# Patient Record
Sex: Female | Born: 1990 | Race: White | Hispanic: No | Marital: Single | Smoking: Current every day smoker
Health system: Southern US, Community
[De-identification: ages and names within clinical notes are randomized; demographics above are authoritative.]

## PROBLEM LIST (undated history)

## (undated) ENCOUNTER — Inpatient Hospital Stay (HOSPITAL_COMMUNITY): Payer: Self-pay

## (undated) DIAGNOSIS — F1120 Opioid dependence, uncomplicated: Secondary | ICD-10-CM

## (undated) DIAGNOSIS — B009 Herpesviral infection, unspecified: Secondary | ICD-10-CM

## (undated) DIAGNOSIS — Z1159 Encounter for screening for other viral diseases: Secondary | ICD-10-CM

## (undated) DIAGNOSIS — Z765 Malingerer [conscious simulation]: Secondary | ICD-10-CM

## (undated) DIAGNOSIS — F410 Panic disorder [episodic paroxysmal anxiety] without agoraphobia: Secondary | ICD-10-CM

## (undated) DIAGNOSIS — R569 Unspecified convulsions: Secondary | ICD-10-CM

## (undated) HISTORY — DX: Malingerer (conscious simulation): Z76.5

## (undated) HISTORY — PX: APPENDECTOMY: SHX54

---
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
## 2011-09-11 ENCOUNTER — Encounter: Payer: Self-pay | Admitting: Emergency Medicine

## 2011-09-11 ENCOUNTER — Emergency Department (HOSPITAL_COMMUNITY)
Admission: EM | Admit: 2011-09-11 | Discharge: 2011-09-11 | Disposition: A | Discharge status: Home / Self Care | Payer: Self-pay | Attending: Emergency Medicine | Admitting: Emergency Medicine

## 2011-09-11 DIAGNOSIS — R10819 Abdominal tenderness, unspecified site: Secondary | ICD-10-CM | POA: Insufficient documentation

## 2011-09-11 DIAGNOSIS — N72 Inflammatory disease of cervix uteri: Secondary | ICD-10-CM | POA: Insufficient documentation

## 2011-09-11 DIAGNOSIS — N898 Other specified noninflammatory disorders of vagina: Secondary | ICD-10-CM | POA: Insufficient documentation

## 2011-09-11 DIAGNOSIS — R109 Unspecified abdominal pain: Secondary | ICD-10-CM | POA: Insufficient documentation

## 2011-09-11 DIAGNOSIS — F17200 Nicotine dependence, unspecified, uncomplicated: Secondary | ICD-10-CM | POA: Insufficient documentation

## 2011-09-11 HISTORY — DX: Panic disorder (episodic paroxysmal anxiety): F41.0

## 2011-09-11 LAB — CBC
HCT: 43.3 % (ref 36.0–46.0)
MCV: 91.4 fL (ref 78.0–100.0)
Platelets: 338 10*3/uL (ref 150–400)
RBC: 4.74 MIL/uL (ref 3.87–5.11)
WBC: 10.9 10*3/uL — ABNORMAL HIGH (ref 4.0–10.5)

## 2011-09-11 LAB — DIFFERENTIAL
Basophils Absolute: 0 10*3/uL (ref 0.0–0.1)
Eosinophils Relative: 1 % (ref 0–5)
Lymphocytes Relative: 31 % (ref 12–46)
Lymphs Abs: 3.3 10*3/uL (ref 0.7–4.0)
Monocytes Absolute: 0.9 10*3/uL (ref 0.1–1.0)
Neutro Abs: 6.5 10*3/uL (ref 1.7–7.7)

## 2011-09-11 LAB — HEPATIC FUNCTION PANEL
ALT: 13 U/L (ref 0–35)
AST: 23 U/L (ref 0–37)
Albumin: 4 g/dL (ref 3.5–5.2)
Alkaline Phosphatase: 75 U/L (ref 39–117)
Total Protein: 7.6 g/dL (ref 6.0–8.3)

## 2011-09-11 LAB — URINALYSIS, ROUTINE W REFLEX MICROSCOPIC
Glucose, UA: NEGATIVE mg/dL
Hgb urine dipstick: NEGATIVE
Protein, ur: NEGATIVE mg/dL
pH: 5.5 (ref 5.0–8.0)

## 2011-09-11 LAB — URINE MICROSCOPIC-ADD ON

## 2011-09-11 LAB — BASIC METABOLIC PANEL
CO2: 24 mEq/L (ref 19–32)
Calcium: 9.3 mg/dL (ref 8.4–10.5)
Chloride: 106 mEq/L (ref 96–112)
Glucose, Bld: 89 mg/dL (ref 70–99)
Sodium: 140 mEq/L (ref 135–145)

## 2011-09-11 LAB — WET PREP, GENITAL
Trich, Wet Prep: NONE SEEN
Yeast Wet Prep HPF POC: NONE SEEN

## 2011-09-11 MED ORDER — CEFTRIAXONE SODIUM 250 MG IJ SOLR
250.0000 mg | Freq: Once | INTRAMUSCULAR | Status: AC
  Administered 2011-09-11: 250 mg via INTRAMUSCULAR
  Filled 2011-09-11: qty 250

## 2011-09-11 MED ORDER — METRONIDAZOLE 500 MG PO TABS: 2000.0000 mg | ORAL_TABLET | Freq: Once | ORAL | Status: DC

## 2011-09-11 MED ORDER — METRONIDAZOLE 500 MG PO TABS: 500.0000 mg | ORAL_TABLET | Freq: Two times a day (BID) | ORAL | Status: AC

## 2011-09-11 MED ORDER — DOXYCYCLINE HYCLATE 100 MG PO CAPS: 100.0000 mg | ORAL_CAPSULE | Freq: Two times a day (BID) | ORAL | Status: AC

## 2011-09-11 MED ORDER — AZITHROMYCIN 1 G PO PACK
1.0000 g | PACK | Freq: Once | ORAL | Status: AC
  Administered 2011-09-11: 1 g via ORAL
  Filled 2011-09-11: qty 1

## 2011-09-11 MED ORDER — LIDOCAINE HCL (PF) 1 % IJ SOLN
INTRAMUSCULAR | Status: AC
  Administered 2011-09-11: 09:00:00
  Filled 2011-09-11: qty 5

## 2011-09-11 NOTE — ED Notes (Signed)
Pelvic cart at bedside. 

## 2011-09-11 NOTE — ED Provider Notes (Signed)
History     CSN: [id number]  Arrival date & time 09/11/11  1191   First MD Initiated Contact with Patient 09/11/11 [phone]      Chief Complaint  Patient presents with  . Abdominal Pain    (Consider location/radiation/quality/duration/timing/severity/associated sxs/prior treatment) Patient is a 20 y.o. female presenting with abdominal pain. The history is provided by the patient.  Abdominal Pain The primary symptoms of the illness include abdominal pain.   patient presents emergency Dept. with a one-week history of vaginal discharge.  She states that she has had some mild lower abdominal pain with this.  She states that she had sexual contact with another female who informed her after that she had chlamydia.  She states that she does not have dysuria, diarrhea/vomiting/nausea, fever, back pain, chest pain, or shortness of breath.  Patient states that she has not had any sexually transmitted diseases in the past.   Past Medical History  Diagnosis Date  . Panic attack     Past Surgical History  Procedure Date  . Appendectomy     No family history on file.  History  Substance Use Topics  . Smoking status: Current Everyday Smoker  . Smokeless tobacco: Not on file  . Alcohol Use: Yes     OCCASIONAL    OB History    Grav Para Term Preterm Abortions TAB SAB Ect Mult Living                  Review of Systems  Gastrointestinal: Positive for abdominal pain.  All pertinent positives and negatives reviewed in the history of present illness   Allergies  Review of patient's allergies indicates no known allergies.  Home Medications  No current outpatient prescriptions on file.  BP 115/64  Pulse 71  Temp(Src) 98 F (36.7 C) (Oral)  Resp 16  SpO2 98%  LMP 08/28/2011  Physical Exam  Constitutional: She is oriented to person, place, and time. She appears well-developed and well-nourished.  HENT:  Head: Normocephalic and atraumatic.  Eyes: Pupils are equal, round, and  reactive to light.  Cardiovascular: Normal rate, regular rhythm and normal heart sounds.   Pulmonary/Chest: Effort normal and breath sounds normal.  Abdominal: Soft. She exhibits no distension. There is tenderness. There is no rebound and no guarding.  Genitourinary: There is no rash, tenderness or lesion on the right labia. There is no rash, tenderness or lesion on the left labia. Cervix exhibits discharge. Cervix exhibits no motion tenderness. Right adnexum displays tenderness. Right adnexum displays no mass and no fullness. Left adnexum displays no mass, no tenderness and no fullness. There is tenderness around the vagina. No bleeding around the vagina. Vaginal discharge found.  Neurological: She is alert and oriented to person, place, and time.  Skin: Skin is warm and dry. No rash noted. No erythema. No pallor.    ED Course  Procedures (including critical care time)  Labs Reviewed  CBC - Abnormal; Notable for the following:    WBC 10.9 (*)    All other components within normal limits  URINALYSIS, ROUTINE W REFLEX MICROSCOPIC - Abnormal; Notable for the following:    APPearance CLOUDY (*)    Nitrite POSITIVE (*)    Leukocytes, UA TRACE (*)    All other components within normal limits  HEPATIC FUNCTION PANEL - Abnormal; Notable for the following:    Total Bilirubin 0.2 (*)    All other components within normal limits  URINE MICROSCOPIC-ADD ON - Abnormal; Notable for the following:  Squamous Epithelial / LPF FEW (*)    Bacteria, UA MANY (*)    All other components within normal limits  DIFFERENTIAL  BASIC METABOLIC PANEL  LIPASE, BLOOD  POCT PREGNANCY, URINE  GC/CHLAMYDIA PROBE AMP, GENITAL  WET PREP, GENITAL    Patient will be given IM Rocephin and Zithromax here in the emergency department.  I will treat her with 14 days worth of doxycycline as well based on her urine testing.       MDM  Cervicitis based on HPI and PE.  Carlyle Dolly, PA-C 09/11/11 [phone]

## 2011-09-11 NOTE — ED Provider Notes (Signed)
Medical screening examination/treatment/procedure(s) were performed by non-physician practitioner and as supervising physician I was immediately available for consultation/collaboration.   Awa Bachicha A. Adorian Gwynne, MD 09/11/11 1535 

## 2011-09-11 NOTE — ED Notes (Signed)
PT. REPORTS MID ABDOMINAL PAIN FOR 1 WEEK , DENIES NAUSEA ,VOMITTING OR DIARRHEA , NO FEVER OR CHILLS ,  NO DYSURIA , SLIGHT VAGINAL DISCHARGE.

## 2011-09-13 LAB — GC/CHLAMYDIA PROBE AMP, GENITAL
Chlamydia, DNA Probe: NEGATIVE
GC Probe Amp, Genital: NEGATIVE

## 2012-04-23 ENCOUNTER — Encounter (HOSPITAL_COMMUNITY): Payer: Self-pay | Admitting: *Deleted

## 2012-04-23 ENCOUNTER — Emergency Department (HOSPITAL_COMMUNITY)
Admission: EM | Admit: 2012-04-23 | Discharge: 2012-04-24 | Disposition: A | Discharge status: Rehabilitation Facility | Payer: Self-pay | Attending: Emergency Medicine | Admitting: Emergency Medicine

## 2012-04-23 DIAGNOSIS — IMO0001 Reserved for inherently not codable concepts without codable children: Secondary | ICD-10-CM | POA: Insufficient documentation

## 2012-04-23 DIAGNOSIS — F1910 Other psychoactive substance abuse, uncomplicated: Secondary | ICD-10-CM | POA: Insufficient documentation

## 2012-04-23 DIAGNOSIS — F1110 Opioid abuse, uncomplicated: Secondary | ICD-10-CM | POA: Insufficient documentation

## 2012-04-23 HISTORY — DX: Opioid dependence, uncomplicated: F11.20

## 2012-04-23 LAB — COMPREHENSIVE METABOLIC PANEL
AST: 13 U/L (ref 0–37)
Albumin: 4.2 g/dL (ref 3.5–5.2)
BUN: 11 mg/dL (ref 6–23)
Calcium: 9.5 mg/dL (ref 8.4–10.5)
Chloride: 100 mEq/L (ref 96–112)
Creatinine, Ser: 0.69 mg/dL (ref 0.50–1.10)
GFR calc non Af Amer: >90 mL/min (ref >90)
Total Bilirubin: 0.2 mg/dL — ABNORMAL LOW (ref 0.3–1.2)

## 2012-04-23 LAB — RAPID URINE DRUG SCREEN, HOSP PERFORMED
Amphetamines: NOT DETECTED
Cocaine: POSITIVE — AB
Opiates: POSITIVE — AB

## 2012-04-23 LAB — CBC
HCT: 41.8 % (ref 36.0–46.0)
MCH: 30 pg (ref 26.0–34.0)
MCV: 90.9 fL (ref 78.0–100.0)
Platelets: 392 10*3/uL (ref 150–400)
RDW: 13.1 % (ref 11.5–15.5)
WBC: 9.5 10*3/uL (ref 4.0–10.5)

## 2012-04-23 LAB — ETHANOL: Alcohol, Ethyl (B): <11 mg/dL (ref 0–11)

## 2012-04-23 MED ORDER — ALUM & MAG HYDROXIDE-SIMETH 200-200-20 MG/5ML PO SUSP: 30.0000 mL | ORAL | Status: DC | PRN

## 2012-04-23 MED ORDER — LORAZEPAM 1 MG PO TABS
1.0000 mg | ORAL_TABLET | Freq: Three times a day (TID) | ORAL | Status: DC | PRN
  Administered 2012-04-23 – 2012-04-24 (×3): 1 mg via ORAL
  Filled 2012-04-23 (×3): qty 1

## 2012-04-23 MED ORDER — ONDANSETRON HCL 4 MG PO TABS
4.0000 mg | ORAL_TABLET | Freq: Three times a day (TID) | ORAL | Status: DC | PRN
  Administered 2012-04-24: 4 mg via ORAL
  Filled 2012-04-23: qty 1

## 2012-04-23 MED ORDER — ACETAMINOPHEN 325 MG PO TABS: 650.0000 mg | ORAL_TABLET | ORAL | Status: DC | PRN

## 2012-04-23 MED ORDER — NICOTINE 21 MG/24HR TD PT24
21.0000 mg | MEDICATED_PATCH | Freq: Every day | TRANSDERMAL | Status: DC
  Administered 2012-04-23 – 2012-04-24 (×2): 21 mg via TRANSDERMAL
  Filled 2012-04-23 (×2): qty 1

## 2012-04-23 NOTE — BH Assessment (Signed)
Assessment Note   Shannon Morrison is an 21 y.o. female who presents voluntarily to [facility] for heroin detox. Pt states only other time she went to detox was 2012 at [facility] but his family made her go. Pt reports she really wants to get clean b/c she's sick of feeling bad. She has no hx of seizures. Current withdrawal symptoms include sweats, nausea. Pt reports labile mood and affect is appropriate to situation. Pt reports frequent panic attacks and states she takes her grandmother's 1 mg Xanax 3 times per week to reduce anxiety.  She denies AH & VH. No delusions noted. Pt denies SI and HI. Pt's mother died of crack cocaine overdose when pt was 20 yo and many of her relatives have SA issues. Pt was released from jail May 21st after 45 days for probation violation. Pt first used heroin at age 51. She injects approx 1g to 1.5 g daily for past 3 years. She injected 0.5 grams on 04/23/12. Pt first used THC at age 4. She smokes approx 1 joint 3 x week and has done so for 9 years. She smoked 1 joint 04/22/12. Pt "rarely" uses cocaine but used 2 lines 04/22/12. She has 22 year old and 74 year old children who live with relatives.  Axis I: Opiate Dependence            Anxiety Disorder NOS Axis II: Deferred Axis III:  Past Medical History  Diagnosis Date  . Panic attack   . Heroin addiction     x 3 years   Axis IV: economic problems, other psychosocial or environmental problems, problems related to legal system/crime, problems related to social environment and problems with primary support group Axis V: 31-40 impairment in reality testing  Past Medical History:  Past Medical History  Diagnosis Date  . Panic attack   . Heroin addiction     x 3 years    Past Surgical History  Procedure Date  . Appendectomy     Family History: History reviewed. No pertinent family history.  Social History:  reports that she has been smoking Cigarettes.  She has been smoking about 1 pack per day. She does not have any  smokeless tobacco history on file. She reports that she drinks alcohol. She reports that she uses illicit drugs (Marijuana and Heroin).  Additional Social History:  Alcohol / Drug Use Pain Medications: n/a Prescriptions: uses grandmother's xanax Over the Counter: n/a History of alcohol / drug use?: Yes Negative Consequences of Use: Personal relationships;Work / Optician, dispensing Withdrawal Symptoms: Fever / Chills;Sweats;Nausea / Vomiting;Weakness Substance #1 Name of Substance 1: heroin - injects it 1 - Age of First Use: 18 1 - Amount (size/oz): 1 g to 1.5 grams 1 - Frequency: daily 1 - Duration: for past 3 years 1 - Last Use / Amount: 04/23/02 - 0.5 g Substance #2 Name of Substance 2: THC 2 - Age of First Use: 12 2 - Amount (size/oz): 1 joint 2 - Frequency: 3 x week 2 - Duration: 9 yrs 2 - Last Use / Amount: 04/22/12 - 1 joint Substance #3 Name of Substance 3: xanax 1 mg 3 - Age of First Use: 17 3 - Amount (size/oz): 1 mg 3 - Frequency: once every 3 days when anxious 3 - Duration: 4 years 3 - Last Use / Amount: unknown Substance #4 Name of Substance 4: cocaine 4 - Age of First Use: 17 4 - Amount (size/oz): 2 lines 4 - Frequency: "rarely" 4 - Duration: rarely 4 -  Last Use / Amount: 04/22/12 - 2 lines  CIWA: CIWA-Ar BP: 109/57 mmHg Pulse Rate: 63  COWS:    Allergies: No Known Allergies  Home Medications:  (Not in a hospital admission)  OB/GYN Status:  Patient's last menstrual period was 04/14/2012.  General Assessment Data Location of Assessment: [facility] ED Living Arrangements: Non-relatives/Friends Can pt return to current living arrangement?: Yes Admission Status: Voluntary Is patient capable of signing voluntary admission?: Yes Transfer from: Acute Hospital Referral Source: Self/Family/Friend  Education Status Is patient currently in school?: No Current Grade: na Highest grade of school patient has completed: 9 Name of school: [employer]  person: na  Risk to self Suicidal Ideation: No Suicidal Intent: No Is patient at risk for suicide?: No Suicidal Plan?: No-Not Currently/Within Last 6 Months Access to Means: No What has been your use of drugs/alcohol within the last 12 months?: daily heroin use, thc 3 x week Previous Attempts/Gestures: No How many times?: 0  Other Self Harm Risks: na Triggers for Past Attempts:  (na) Intentional Self Injurious Behavior: None Family Suicide History: No Recent stressful life event(s):  (pt denies stressors) Persecutory voices/beliefs?: No Depression: Yes Depression Symptoms: Isolating;Loss of interest in usual pleasures Substance abuse history and/or treatment for substance abuse?: Yes [facility] 2012) Suicide prevention information given to non-admitted patients: Not applicable  Risk to Others Homicidal Ideation: No Thoughts of Harm to Others: No Current Homicidal Intent: No Current Homicidal Plan: No Access to Homicidal Means: No Identified Victim: na History of harm to others?: No Assessment of Violence: None Noted Violent Behavior Description: pt calm and cooperative Does patient have access to weapons?: No Criminal Charges Pending?: No Does patient have a court date: No  Psychosis Hallucinations: None noted Delusions: None noted  Mental Status Report Appear/Hygiene: Disheveled Eye Contact: Good Motor Activity: Freedom of movement Speech: Logical/coherent Level of Consciousness: Alert Mood: Labile Affect: Appropriate to circumstance Anxiety Level: Panic Attacks Panic attack frequency: 1 x week Most recent panic attack: last week Thought Processes: Coherent;Relevant Judgement: Unimpaired Orientation: Person;Place;Time;Situation Obsessive Compulsive Thoughts/Behaviors: None  Cognitive Functioning Concentration: Normal Memory: Recent Impaired;Remote Intact IQ: Average Insight: Fair Impulse Control: Poor Appetite: Fair Weight Loss: 0  Weight  Gain: 0  Sleep: No Change Total Hours of Sleep: 5  Vegetative Symptoms: None  ADLScreening [facility] Assessment Services) Patient's cognitive ability adequate to safely complete daily activities?: Yes Patient able to express need for assistance with ADLs?: Yes Independently performs ADLs?: Yes  Abuse/Neglect [facility]) Physical Abuse: Denies Verbal Abuse: Denies Sexual Abuse: Denies  Prior Inpatient Therapy Prior Inpatient Therapy: Yes Prior Therapy Dates: 2012 Prior Therapy Facilty/Provider(s): [facility] Reason for Treatment: detox heroin  Prior Outpatient Therapy Prior Outpatient Therapy: No Prior Therapy Dates: na Prior Therapy Facilty/Provider(s): na Reason for Treatment: na  ADL Screening (condition at time of admission) Patient's cognitive ability adequate to safely complete daily activities?: Yes Patient able to express need for assistance with ADLs?: Yes Independently performs ADLs?: Yes Weakness of Legs: None Weakness of Arms/Hands: None  Home Assistive Devices/Equipment Home Assistive Devices/Equipment: None    Abuse/Neglect Assessment (Assessment to be complete while patient is alone) Physical Abuse: Denies Verbal Abuse: Denies Sexual Abuse: Denies Exploitation of patient/patient's resources: Denies Self-Neglect: Denies Values / Beliefs Cultural Requests During Hospitalization: None Spiritual Requests During Hospitalization: None   Advance Directives (For Healthcare) Advance Directive: Patient does not have advance directive;Patient would not like information    Additional Information 1:1 In Past 12 Months?: No CIRT Risk:  No Elopement Risk: No Does patient have medical clearance?: Yes     Disposition:  Disposition Disposition of Patient: Inpatient treatment program;Referred to Type of inpatient treatment program: Adult Patient referred to: [facility]  On Site Evaluation by:   Reviewed with Physician:     Donnamarie Rossetti P 04/23/2012  11:08 PM

## 2012-04-23 NOTE — ED Notes (Signed)
Pt states heroin addiction, wishes detox. Pt last used heroin 6-7 hours ago.

## 2012-04-23 NOTE — ED Provider Notes (Signed)
History     CSN: [id number]  Arrival date & time 04/23/12  1953   First MD Initiated Contact with Patient 04/23/12 2105      Chief Complaint  Patient presents with  . Medical Clearance    (Consider location/radiation/quality/duration/timing/severity/associated sxs/prior treatment) HPI Hx from pt. Shannon Morrison is a 21 y.o. female presenting for medical clearance. She states that she has been using heroin daily for the past 3 years and presents today seeking detox. She last used approx 6-7 hrs prior to arrival. Has undergone detox one time in the past but states it was "only because my family made me. I really didn't want to go." She occasionally uses marijuana and etoh as well but no other substance use. States she has hx of anxiety but denies HI/SI. She denies somatic complaints at this time.  Past Medical History  Diagnosis Date  . Panic attack   . Heroin addiction     x 3 years    Past Surgical History  Procedure Date  . Appendectomy     History reviewed. No pertinent family history.  History  Substance Use Topics  . Smoking status: Current Everyday Smoker -- 1.0 packs/day    Types: Cigarettes  . Smokeless tobacco: Not on file  . Alcohol Use: Yes     OCCASIONAL    OB History    Grav Para Term Preterm Abortions TAB SAB Ect Mult Living                  Review of Systems  Constitutional: Negative for fever and chills.  HENT: Negative for congestion, sore throat and neck pain.   Eyes: Negative for photophobia and visual disturbance.  Respiratory: Negative for cough and shortness of breath.   Cardiovascular: Negative for chest pain.  Gastrointestinal: Negative for nausea, vomiting and abdominal pain.  Genitourinary: Negative for dysuria, vaginal bleeding and vaginal discharge.  Musculoskeletal: Positive for myalgias (generalized myalgias 2/2 withdrawal).  Skin: Negative for rash.  Neurological: Negative for dizziness, weakness and headaches.    Psychiatric/Behavioral: Negative for hallucinations, confusion, disturbed wake/sleep cycle and self-injury. The patient is not nervous/anxious.     Allergies  Review of patient's allergies indicates no known allergies.  Home Medications  No current outpatient prescriptions on file.  BP 123/62  Pulse 88  Temp 98.5 F (36.9 C) (Oral)  Resp 16  SpO2 98%  LMP 04/14/2012  Physical Exam  Nursing note and vitals reviewed. Constitutional: She is oriented to person, place, and time. She appears well-developed and well-nourished. No distress.  HENT:  Head: Normocephalic and atraumatic.  Mouth/Throat: Oropharynx is clear and moist. No oropharyngeal exudate.  Eyes: EOM are normal. Pupils are equal, round, and reactive to light.  Neck: Normal range of motion.  Cardiovascular: Normal rate, regular rhythm and normal heart sounds.   Pulmonary/Chest: Effort normal and breath sounds normal. She exhibits no tenderness.  Abdominal: Soft. Bowel sounds are normal. There is no tenderness. There is no rebound and no guarding.  Musculoskeletal: Normal range of motion.  Neurological: She is alert and oriented to person, place, and time. No cranial nerve deficit.  Skin: Skin is warm and dry. She is not diaphoretic.  Psychiatric: She has a normal mood and affect.    ED Course  Procedures (including critical care time)  Labs Reviewed  COMPREHENSIVE METABOLIC PANEL - Abnormal; Notable for the following:    Total Bilirubin 0.2 (*)     All other components within normal limits  URINE RAPID DRUG  SCREEN (HOSP PERFORMED) - Abnormal; Notable for the following:    Opiates POSITIVE (*)     Cocaine POSITIVE (*)     Benzodiazepines POSITIVE (*)     Tetrahydrocannabinol POSITIVE (*)     All other components within normal limits  CBC  ETHANOL  POCT PREGNANCY, URINE   No results found.   No diagnosis found.    MDM  Pt presents seeking heroin detox. Medically clear for further evaluation in the  psych ED. I d/w ACT who will see.      Grant Fontana, PA-C 04/23/12 2203

## 2012-04-23 NOTE — ED Notes (Signed)
ACT team in to see patient. 

## 2012-04-24 MED ORDER — ONDANSETRON 4 MG PO TBDP
8.0000 mg | ORAL_TABLET | Freq: Once | ORAL | Status: AC
  Administered 2012-04-24: 8 mg via ORAL

## 2012-04-24 MED ORDER — ONDANSETRON HCL 4 MG PO TABS
4.0000 mg | ORAL_TABLET | Freq: Four times a day (QID) | ORAL | Status: DC | PRN
  Administered 2012-04-24: 4 mg via ORAL
  Filled 2012-04-24: qty 1

## 2012-04-24 MED ORDER — ONDANSETRON 4 MG PO TBDP
ORAL_TABLET | ORAL | Status: AC
  Administered 2012-04-24: 8 mg via ORAL
  Filled 2012-04-24: qty 2

## 2012-04-24 MED ORDER — PROMETHAZINE HCL 25 MG PO TABS
25.0000 mg | ORAL_TABLET | Freq: Four times a day (QID) | ORAL | Status: DC | PRN
  Administered 2012-04-24: 25 mg via ORAL
  Filled 2012-04-24: qty 1

## 2012-04-24 NOTE — BH Assessment (Signed)
[facility] Assessment Progress Note      Pt has been reassessed and screening completed at [facility]; pt was accepted at 1830 by Arlys John. [facility] given cell phone number to contact when [facility] will pick up (may be as late as midnight per Arlys John).  Dr and nursing staff notified and agreeable with disposition. Awaiting transfer.

## 2012-04-24 NOTE — ED Provider Notes (Signed)
Medical screening examination/treatment/procedure(s) were performed by non-physician practitioner and as supervising physician I was immediately available for consultation/collaboration. Devoria Albe, MD, Armando Gang   Ward Givens, MD 04/24/12 [phone]

## 2012-07-12 ENCOUNTER — Emergency Department (HOSPITAL_COMMUNITY)
Admission: EM | Admit: 2012-07-12 | Discharge: 2012-07-12 | Disposition: A | Discharge status: Home / Self Care | Payer: Self-pay | Attending: Emergency Medicine | Admitting: Emergency Medicine

## 2012-07-12 ENCOUNTER — Encounter (HOSPITAL_COMMUNITY): Payer: Self-pay | Admitting: Emergency Medicine

## 2012-07-12 DIAGNOSIS — R112 Nausea with vomiting, unspecified: Secondary | ICD-10-CM | POA: Insufficient documentation

## 2012-07-12 DIAGNOSIS — Z8669 Personal history of other diseases of the nervous system and sense organs: Secondary | ICD-10-CM | POA: Insufficient documentation

## 2012-07-12 DIAGNOSIS — F17200 Nicotine dependence, unspecified, uncomplicated: Secondary | ICD-10-CM | POA: Insufficient documentation

## 2012-07-12 DIAGNOSIS — F1911 Other psychoactive substance abuse, in remission: Secondary | ICD-10-CM | POA: Insufficient documentation

## 2012-07-12 HISTORY — DX: Unspecified convulsions: R56.9

## 2012-07-12 LAB — URINALYSIS, ROUTINE W REFLEX MICROSCOPIC
Glucose, UA: NEGATIVE mg/dL
Leukocytes, UA: NEGATIVE
Nitrite: NEGATIVE
Protein, ur: 30 mg/dL — AB
pH: 6 (ref 5.0–8.0)

## 2012-07-12 LAB — COMPREHENSIVE METABOLIC PANEL
ALT: 16 U/L (ref 0–35)
AST: 18 U/L (ref 0–37)
Albumin: 4 g/dL (ref 3.5–5.2)
Alkaline Phosphatase: 67 U/L (ref 39–117)
BUN: 11 mg/dL (ref 6–23)
Chloride: 101 mEq/L (ref 96–112)
Potassium: 3.7 mEq/L (ref 3.5–5.1)
Sodium: 137 mEq/L (ref 135–145)
Total Bilirubin: 0.3 mg/dL (ref 0.3–1.2)
Total Protein: 7.5 g/dL (ref 6.0–8.3)

## 2012-07-12 LAB — CBC WITH DIFFERENTIAL/PLATELET
Basophils Absolute: 0 10*3/uL (ref 0.0–0.1)
Basophils Relative: 0 % (ref 0–1)
Hemoglobin: 14.5 g/dL (ref 12.0–15.0)
MCHC: 33.2 g/dL (ref 30.0–36.0)
Monocytes Relative: 3 % (ref 3–12)
Neutro Abs: 9.3 10*3/uL — ABNORMAL HIGH (ref 1.7–7.7)
Neutrophils Relative %: 83 % — ABNORMAL HIGH (ref 43–77)
Platelets: 328 10*3/uL (ref 150–400)

## 2012-07-12 LAB — URINE MICROSCOPIC-ADD ON

## 2012-07-12 LAB — PREGNANCY, URINE: Preg Test, Ur: NEGATIVE

## 2012-07-12 MED ORDER — PROMETHAZINE HCL 25 MG/ML IJ SOLN
25.0000 mg | Freq: Once | INTRAMUSCULAR | Status: AC
  Administered 2012-07-12: 25 mg via INTRAMUSCULAR

## 2012-07-12 MED ORDER — SODIUM CHLORIDE 0.9 % IV BOLUS (SEPSIS): 1000.0000 mL | Freq: Once | INTRAVENOUS | Status: DC

## 2012-07-12 MED ORDER — PROMETHAZINE HCL 25 MG/ML IJ SOLN
12.5000 mg | Freq: Once | INTRAMUSCULAR | Status: DC
  Filled 2012-07-12: qty 1

## 2012-07-12 MED ORDER — SODIUM CHLORIDE 0.9 % IV BOLUS (SEPSIS)
1000.0000 mL | Freq: Once | INTRAVENOUS | Status: AC
  Administered 2012-07-12: 1000 mL via INTRAVENOUS

## 2012-07-12 MED ORDER — ONDANSETRON 4 MG PO TBDP
8.0000 mg | ORAL_TABLET | Freq: Once | ORAL | Status: AC
  Administered 2012-07-12: 8 mg via ORAL
  Filled 2012-07-12: qty 2

## 2012-07-12 NOTE — ED Provider Notes (Signed)
History     CSN: [id number]  Arrival date & time 07/12/12  0819   First MD Initiated Contact with Patient 07/12/12 [phone]      Chief Complaint  Patient presents with  . Emesis    (Consider location/radiation/quality/duration/timing/severity/associated sxs/prior treatment) HPI Pt is a 21 yo female who presents to the ER with a cc of nausea and vomiting.  The first episode of vomiting occurred at 5:40am this morning before she ate the second occurred shortly after she ate breakfast.  LMP was reported as being 4 days ago.  Pt is not on birth control.  Nothing makes it better, eating seemed to make it worse. Did not try any medications prior to the arrival. Pt denies fever, chills, upper respiratory symptoms, diarrhea, abdominal pain, chest pain, SOB, HA, weakness, loss of consciousness, dysuria, dizziness, or blurred vision.  Past Medical History  Diagnosis Date  . Panic attack   . Heroin addiction     x 3 years  . Seizures     Past Surgical History  Procedure Date  . Appendectomy     No family history on file.  History  Substance Use Topics  . Smoking status: Current Every Day Smoker -- 1.0 packs/day    Types: Cigarettes  . Smokeless tobacco: Not on file  . Alcohol Use: No     OCCASIONAL    OB History    Grav Para Term Preterm Abortions TAB SAB Ect Mult Living                  Review of Systems  Gastrointestinal: Positive for nausea and vomiting.  All other systems reviewed and are negative.     Allergies  Review of patient's allergies indicates no known allergies.  Home Medications  No current outpatient prescriptions on file.  BP 103/85  Pulse 56  Temp 97.9 F (36.6 C) (Oral)  Resp 20  Ht 5\' 9"  (1.753 m)  Wt 150 lb (68.04 kg)  BMI 22.15 kg/m2  SpO2 100%  Physical Exam  Constitutional: She is oriented to person, place, and time. She appears well-developed and well-nourished. No distress.  HENT:  Head: Normocephalic and atraumatic.    Mouth/Throat: Oropharynx is clear and moist.  Eyes: Pupils are equal, round, and reactive to light.  Neck: Normal range of motion.  Cardiovascular: Normal rate, regular rhythm and normal heart sounds.   Pulmonary/Chest: Effort normal and breath sounds normal. No respiratory distress. She has no wheezes.  Abdominal: Soft. Bowel sounds are normal. She exhibits no distension. There is no hepatosplenomegaly. There is no tenderness. There is no rebound and no guarding.  Neurological: She is alert and oriented to person, place, and time.  Skin: Skin is warm and dry. No rash noted. No erythema.    ED Course  Procedures (including critical care time) Pt with N/V onset this morning. No abdominal pain. Afebrile. VS normal. Will get labs, fluids, anti emetics.   Results for orders placed during the hospital encounter of 07/12/12  URINALYSIS, ROUTINE W REFLEX MICROSCOPIC      Component Value Range   Color, Urine AMBER (*) YELLOW   APPearance HAZY (*) CLEAR   Specific Gravity, Urine 1.035 (*) 1.005 - 1.030   pH 6.0  5.0 - 8.0   Glucose, UA NEGATIVE  NEGATIVE mg/dL   Hgb urine dipstick NEGATIVE  NEGATIVE   Bilirubin Urine MODERATE (*) NEGATIVE   Ketones, ur 40 (*) NEGATIVE mg/dL   Protein, ur 30 (*) NEGATIVE mg/dL  Urobilinogen, UA 1.0  0.0 - 1.0 mg/dL   Nitrite NEGATIVE  NEGATIVE   Leukocytes, UA NEGATIVE  NEGATIVE  PREGNANCY, URINE      Component Value Range   Preg Test, Ur NEGATIVE  NEGATIVE  CBC WITH DIFFERENTIAL      Component Value Range   WBC 11.3 (*) 4.0 - 10.5 K/uL   RBC 4.72  3.87 - 5.11 MIL/uL   Hemoglobin 14.5  12.0 - 15.0 g/dL   HCT 56.2  13.0 - 86.5 %   MCV 92.6  78.0 - 100.0 fL   MCH 30.7  26.0 - 34.0 pg   MCHC 33.2  30.0 - 36.0 g/dL   RDW 78.4  69.6 - 29.5 %   Platelets 328  150 - 400 K/uL   Neutrophils Relative 83 (*) 43 - 77 %   Neutro Abs 9.3 (*) 1.7 - 7.7 K/uL   Lymphocytes Relative 13  12 - 46 %   Lymphs Abs 1.5  0.7 - 4.0 K/uL   Monocytes Relative 3  3 - 12 %    Monocytes Absolute 0.4  0.1 - 1.0 K/uL   Eosinophils Relative 1  0 - 5 %   Eosinophils Absolute 0.1  0.0 - 0.7 K/uL   Basophils Relative 0  0 - 1 %   Basophils Absolute 0.0  0.0 - 0.1 K/uL  COMPREHENSIVE METABOLIC PANEL      Component Value Range   Sodium 137  135 - 145 mEq/L   Potassium 3.7  3.5 - 5.1 mEq/L   Chloride 101  96 - 112 mEq/L   CO2 26  19 - 32 mEq/L   Glucose, Bld 91  70 - 99 mg/dL   BUN 11  6 - 23 mg/dL   Creatinine, Ser 2.84  0.50 - 1.10 mg/dL   Calcium 9.3  8.4 - 13.2 mg/dL   Total Protein 7.5  6.0 - 8.3 g/dL   Albumin 4.0  3.5 - 5.2 g/dL   AST 18  0 - 37 U/L   ALT 16  0 - 35 U/L   Alkaline Phosphatase 67  39 - 117 U/L   Total Bilirubin 0.3  0.3 - 1.2 mg/dL   GFR calc non Af Amer >90  >90 mL/min   GFR calc Af Amer >90  >90 mL/min  LIPASE, BLOOD      Component Value Range   Lipase 15  11 - 59 U/L  URINE MICROSCOPIC-ADD ON      Component Value Range   Squamous Epithelial / LPF FEW (*) RARE   WBC, UA 0-2  <3 WBC/hpf   RBC / HPF 0-2  <3 RBC/hpf   Bacteria, UA RARE  RARE   Urine-Other MUCOUS PRESENT     Difficulty in obtaining IV. Will move to CDU. Plan to rehydrate. Anti emetics. If able to tolerate POs, d/c home.     1. Nausea and vomiting       MDM         Lottie Mussel, PA 07/12/12 1532

## 2012-07-12 NOTE — ED Notes (Signed)
Pt c/o vomiting X2 pta, no F/D, no urinary s/s, denies pain, no other complaints, no meds pta, NAD

## 2012-07-12 NOTE — ED Notes (Signed)
IV team called to start IV after 4 attempts.

## 2012-07-12 NOTE — ED Provider Notes (Signed)
Medical screening examination/treatment/procedure(s) were performed by non-physician practitioner and as supervising physician I was immediately available for consultation/collaboration.  Gerhard Munch, MD 07/12/12 [phone]

## 2012-07-12 NOTE — ED Notes (Signed)
Paged IV team 

## 2012-07-12 NOTE — ED Provider Notes (Signed)
Medical screening examination/treatment/procedure(s) were performed by non-physician practitioner and as supervising physician I was immediately available for consultation/collaboration.  Haider Hornaday, MD 07/12/12 1605 

## 2012-07-12 NOTE — ED Provider Notes (Signed)
Awoke this AM with N/V.  Vomiting x2 at home.  WBC slightly elevated, UA with ketones and elevated Bili.  Will give IVF, zofran.  She has received Phenergan IM.  Plan on transfer: IVF, antiemetics, PO challenge  Patient resting comfortably, denies abdominal pain, tolerating by mouth fluids and crackers.  Patient states her nausea has resolved.   On exam: hemodynamically stable, NAD, heart w/ RRR, lungs CTAB, Chest & abd non-tender, no peripheral edema or calf tenderness. Plan: we'll finish IV fluids and discharge home.  Dahlia Client Aithan Farrelly, PA-C 07/12/12 1322

## 2012-09-25 ENCOUNTER — Telehealth (HOSPITAL_COMMUNITY): Payer: Self-pay | Admitting: *Deleted

## 2012-09-25 NOTE — Telephone Encounter (Signed)
Telephoned # on mammogram scholarship and left message to return call to [facility]

## 2012-09-28 ENCOUNTER — Encounter (HOSPITAL_COMMUNITY): Payer: Self-pay | Admitting: *Deleted

## 2012-10-05 ENCOUNTER — Emergency Department: Payer: Self-pay | Admitting: Emergency Medicine

## 2012-10-05 LAB — COMPREHENSIVE METABOLIC PANEL
Albumin: 4.2 g/dL (ref 3.4–5.0)
Alkaline Phosphatase: 92 U/L (ref 50–136)
Anion Gap: 8 (ref 7–16)
BUN: 6 mg/dL — ABNORMAL LOW (ref 7–18)
Bilirubin,Total: 0.9 mg/dL (ref 0.2–1.0)
Calcium, Total: 9.2 mg/dL (ref 8.5–10.1)
Chloride: 108 mmol/L — ABNORMAL HIGH (ref 98–107)
Co2: 23 mmol/L (ref 21–32)
Creatinine: 0.58 mg/dL — ABNORMAL LOW (ref 0.60–1.30)
EGFR (African American): >60
EGFR (Non-African Amer.): >60
SGOT(AST): 22 U/L (ref 15–37)
Total Protein: 8.4 g/dL — ABNORMAL HIGH (ref 6.4–8.2)

## 2012-10-05 LAB — CBC
HCT: 42.8 % (ref 35.0–47.0)
MCH: 30 pg (ref 26.0–34.0)
MCHC: 33 g/dL (ref 32.0–36.0)
MCV: 91 fL (ref 80–100)
Platelet: 407 10*3/uL (ref 150–440)
RBC: 4.7 10*6/uL (ref 3.80–5.20)
RDW: 13.5 % (ref 11.5–14.5)
WBC: 10.1 10*3/uL (ref 3.6–11.0)

## 2012-10-05 LAB — DRUG SCREEN, URINE
Amphetamines, Ur Screen: NEGATIVE (ref ?–1000)
Barbiturates, Ur Screen: NEGATIVE (ref ?–200)
Benzodiazepine, Ur Scrn: POSITIVE (ref ?–200)
Cannabinoid 50 Ng, Ur ~~LOC~~: POSITIVE (ref ?–50)
Cocaine Metabolite,Ur ~~LOC~~: NEGATIVE (ref ?–300)
Opiate, Ur Screen: POSITIVE (ref ?–300)
Phencyclidine (PCP) Ur S: NEGATIVE (ref ?–25)
Tricyclic, Ur Screen: NEGATIVE (ref ?–1000)

## 2012-10-05 LAB — URINALYSIS, COMPLETE
Bilirubin,UR: NEGATIVE
Glucose,UR: NEGATIVE mg/dL (ref 0–75)
Leukocyte Esterase: NEGATIVE
Specific Gravity: 1.021 (ref 1.003–1.030)
Squamous Epithelial: 3
WBC UR: 3 /HPF (ref 0–5)

## 2012-10-05 LAB — ETHANOL
Ethanol %: <0.003 % (ref 0.000–0.080)
Ethanol: <3 mg/dL

## 2012-10-13 ENCOUNTER — Ambulatory Visit (HOSPITAL_COMMUNITY)
Admission: RE | Admit: 2012-10-13 | Discharge: 2012-10-13 | Disposition: A | Discharge status: Home / Self Care | Payer: Self-pay | Source: Ambulatory Visit | Attending: Obstetrics and Gynecology | Admitting: Obstetrics and Gynecology

## 2012-10-13 ENCOUNTER — Encounter (HOSPITAL_COMMUNITY): Payer: Self-pay

## 2012-10-13 ENCOUNTER — Other Ambulatory Visit: Payer: Self-pay | Admitting: Obstetrics and Gynecology

## 2012-10-13 VITALS — BP 100/64 | Temp 97.8°F | Ht 68.0 in | Wt 155.2 lb

## 2012-10-13 DIAGNOSIS — Z01419 Encounter for gynecological examination (general) (routine) without abnormal findings: Secondary | ICD-10-CM

## 2012-10-13 DIAGNOSIS — N63 Unspecified lump in unspecified breast: Secondary | ICD-10-CM

## 2012-10-13 DIAGNOSIS — N6314 Unspecified lump in the right breast, lower inner quadrant: Secondary | ICD-10-CM | POA: Insufficient documentation

## 2012-10-13 NOTE — Progress Notes (Signed)
Patient complained of right breast lump and greenish/bloody nipple discharge x 1 year. Patient complained of right breat pain that she rated at a 7 out of 10.  Pap Smear:  Completed Pap smear today. Patient unsure if she has ever had a Pap smear. No Pap smear results in EPIC.  Physical exam: Breasts Breasts symmetrical. No skin abnormalities left breast. Small open area on right breast at 3 o'clock next to areola and small pimple looking area at 9 o'clock on areola. No nipple retraction bilateral breasts. No nipple discharge left breat. Small amount of whitish colored nipple discharge right breast when expressed. Patient stated right nipple discharge is greenish and bloody usually. Was unable to get enough discharge to send sample to cytology. No lymphadenopathy. No lumps palpated left breast. Palpated lump right breat at 5 o'clock next to areola. Patient complained of pain when palpated lump within right breast.  Referred patient to the [facility] for right breast ultrasound. Appointment scheduled for Monday, February 3,2014 at 0915.     Pelvic/Bimanual   Ext Genitalia No lesions, no swelling and no discharge observed on external genitalia.         Vagina Vagina pink and normal texture. No lesions or discharge observed in vagina.          Cervix Cervix is present. Cervix pink and of normal texture. No discharge observed on cervix.        Uterus Uterus is present and palpable. Uterus tilted slightly to the left and normal size.       Adnexae Bilateral ovaries present and palpable. No tenderness on palpation.        Rectovaginal No rectal exam completed today since patient had no rectal complaints. No skin abnormalities observed on exam.

## 2012-10-13 NOTE — Patient Instructions (Addendum)
Taught patient how to perform BSE. Recommended to patient to have next Pap smear in 2-3 years if Pap today is normal. Referred patient to the [facility] for follow up on long menstrual cycles and birth control. Appointment scheduled for Monday, October 30, 2012 at 1415. Told patient she will need to pay a $20.00 co-pay and fill out financial assistance paperwork. Referred patient to the [facility] for right breast ultrasound. Appointment scheduled for Monday, February 3,2014 at 0915. Patient aware of appointment and will be there. Let patient know will follow up with her within the next couple weeks with results by letter or phone. Talked with patient about smoking cessation and patient refuses to quit at this time. Patient verbalized understanding.

## 2012-10-16 ENCOUNTER — Ambulatory Visit
Admission: RE | Admit: 2012-10-16 | Discharge: 2012-10-16 | Disposition: A | Discharge status: Home / Self Care | Payer: No Typology Code available for payment source | Source: Ambulatory Visit | Attending: Obstetrics and Gynecology | Admitting: Obstetrics and Gynecology

## 2012-10-16 DIAGNOSIS — N63 Unspecified lump in unspecified breast: Secondary | ICD-10-CM

## 2012-10-23 ENCOUNTER — Telehealth (HOSPITAL_COMMUNITY): Payer: Self-pay | Admitting: *Deleted

## 2012-10-23 NOTE — Telephone Encounter (Signed)
Telephoned patient at mobile # and left message to return call.

## 2012-10-30 ENCOUNTER — Ambulatory Visit (INDEPENDENT_AMBULATORY_CARE_PROVIDER_SITE_OTHER): Payer: Self-pay | Admitting: Medical

## 2012-10-30 ENCOUNTER — Encounter: Payer: Self-pay | Admitting: Medical

## 2012-10-30 VITALS — BP 108/73 | HR 71 | Temp 96.5°F | Ht 69.0 in | Wt 153.5 lb

## 2012-10-30 DIAGNOSIS — F419 Anxiety disorder, unspecified: Secondary | ICD-10-CM

## 2012-10-30 DIAGNOSIS — F411 Generalized anxiety disorder: Secondary | ICD-10-CM

## 2012-10-30 DIAGNOSIS — N921 Excessive and frequent menstruation with irregular cycle: Secondary | ICD-10-CM | POA: Insufficient documentation

## 2012-10-30 DIAGNOSIS — N920 Excessive and frequent menstruation with regular cycle: Secondary | ICD-10-CM

## 2012-10-30 LAB — CBC
Hemoglobin: 14 g/dL (ref 12.0–15.0)
MCH: 30 pg (ref 26.0–34.0)
MCV: 89.7 fL (ref 78.0–100.0)
RBC: 4.67 MIL/uL (ref 3.87–5.11)

## 2012-10-30 LAB — POCT PREGNANCY, URINE: Preg Test, Ur: NEGATIVE

## 2012-10-30 MED ORDER — NORGESTIMATE-ETH ESTRADIOL 0.25-35 MG-MCG PO TABS: 1.0000 | ORAL_TABLET | Freq: Every day | ORAL | Status: DC

## 2012-10-30 MED ORDER — ALPRAZOLAM 1 MG PO TABS: 1.0000 mg | ORAL_TABLET | Freq: Every evening | ORAL | Status: DC | PRN

## 2012-10-30 NOTE — Patient Instructions (Addendum)
Menorrhagia  Menorrhagia is when a menstrual period is heavier or longer than normal. HOME CARE  Only take medicine as told by your doctor.  Do not take aspirin 1 week before or during your period. Aspirin can make the bleeding worse.  Lay down for a while if you change your tampon or pad more than once in 2 hours. This may help lessen the bleeding.  Take any iron pills as told by your doctor. Heavy bleeding may cause you to lack iron in your body.  Eat a healthy diet and foods with iron. These foods include leafy green vegetables, meat, liver, eggs, and whole grain breads and cereals.  Eat foods that are high in vitamin C. These include oranges, orange juice, and grapefruits. Vitamin C can help your body take in more iron.  Do not try to lose weight. Wait until the heavy bleeding has stopped and your iron level is normal. GET HELP RIGHT AWAY IF:  You get a fever.  You have trouble breathing.  You bleed even more heavily than usual and pass blood clots.  You feel dizzy, weak, or pass out (faint).  You need to change your tampon or pad more than once an hour.  You feel sick to your stomach (nauseous), throw up (vomit), or have watery poop (diarrhea).  You have problems from medicine. MAKE SURE YOU:   Understand these instructions.  Will watch your condition.  Will get help right away if you are not doing well or get worse. Document Released: 06/08/2008 Document Revised: 11/22/2011 Document Reviewed: 06/08/2008 [facility] Patient Information 2013 [place], [place]. Oral Contraception Information Oral contraceptives (OCs) are medicines taken to prevent pregnancy. OCs work by preventing the ovaries from releasing eggs. The hormones in OCs also cause the cervical mucus to thicken, preventing the sperm from entering the uterus. The hormones also cause the uterine lining to become thin, not allowing a fertilized egg to attach to the inside of the uterus. OCs are highly effective when  taken exactly as prescribed. However, OCs do not prevent sexually transmitted diseases (STDs). Safe sex practices, such as using condoms along with the pill, can help prevent STDs.  Before taking the pill, you may have a physical exam and Pap test. Your caregiver may order blood tests that may be necessary. Your caregiver will make sure you are a good candidate for oral contraception. Discuss with your caregiver the possible side effects of the OC you may be prescribed. When starting an OC, it can take 2 to 3 months for the body to adjust to the changes in hormone levels in your body.  TYPES OF ORAL CONTRACEPTION  The combination pill. This pill contains estrogen and progestin (synthetic progesterone) hormones. The combination pill comes in either 21-day or 28-day packs. With 21-day packs, you do not take pills for 7 days after the last pill. With 28-day packs, the pill is taken every day. The last 7 pills are without hormones. Certain types of pills have more than 21 hormone-containing pills.  The minipill. This pill contains the progesterone hormone only. It is taken every day continuously. The minipill comes in packs of 91 pills. The first 84 pills contain the hormones, and the last 7 pills do not. The last 7 days are when you will have your menstrual period. You may experience irregular spotting. ADVANTAGES  Decreases premenstrual symptoms.  Treats menstrual period cramps.  Regulates the menstrual cycle.  Decreases a heavy menstrual flow.  Treats acne.  Treats abnormal uterine bleeding.  Treats chronic pelvic pain.  Treats polycystic ovarian syndrome.  Treats endometriosis.  Can be used as emergency contraception. DISADVANTAGES OCs can be less effective if:  You forget to take the pill at the same time every day.  You have a stomach or intestinal disease that lessens the absorption of the pill.  You take OCs with other medicines that make OCs less effective.  You take expired  OCs.  You forget to restart the pill on day 7, when using the packs of 21 pills. Document Released: 11/20/2002 Document Revised: 11/22/2011 Document Reviewed: 01/06/2011 [facility] Patient Information 2013 [place], [place].

## 2012-10-30 NOTE — Progress Notes (Signed)
Patient ID: Shannon Morrison, female   DOB: 1991-03-29, 22 y.o.   MRN: [id number]  History:  [name] is a 22 y.o. [id number] who presents to clinic today for menorrhagia. The patient states that she has had issues with heavy prolonged periods x 1 year. She also desires birth control. She was given information about all available options and desires OCPs. The patient was on OCPs at a younger age for birth control and similar heavy bleeding issues and found that they helped. Her periods have been lasting 12-17 days each month. She has not had sexual intercourse in the last 2 weeks. She uses condoms "sometimes" for birth control currently.  Patient also has known anxiety and does not currently have PCP.   The following portions of the patient's history were reviewed and updated as appropriate: allergies, current medications, past family history, past medical history, past social history, past surgical history and problem list.  Review of Systems:  Pertinent items are noted in HPI.  Objective:  Physical Exam BP 108/73  Pulse 71  Temp(Src) 96.5 F (35.8 C) (Oral)  Ht 5\' 9"  (1.753 m)  Wt 153 lb 8 oz (69.627 kg)  BMI 22.66 kg/m2  LMP 10/12/2012 GENERAL: Well-developed, well-nourished female in no acute distress.  HEENT: Normocephalic, atraumatic.  LUNGS: Normal rate. Clear to auscultation bilaterally.  HEART: Regular rate and rhythm with no adventitious sounds.  BREASTS: Deferred. Patient had recent exam and [place] with [facility].  ABDOMEN: Soft, nontender, nondistended. No organomegaly. Normal bowel sounds appreciated in all quadrants.  EXTREMITIES: No cyanosis, clubbing, or edema.  Labs and Imaging UPT - negative   Assessment & Plan:  Assessment: Menorrhagia Undesired fertility  Plans: CBC today Referral information for PCP given Rx for Xanax 1 mg #30 given today. Patient advised that she will need to establish care with a PCP to continue to monitor anxiety. She will not receive additional Rx  from this office.  Rx for Sprintec sent to patient's pharmacy. Patient advised to stop smoking.  Patient may return PRN, she will follow-up as indicated for annual exam with [facility]

## 2012-11-06 ENCOUNTER — Telehealth (HOSPITAL_COMMUNITY): Payer: Self-pay | Admitting: *Deleted

## 2012-11-06 NOTE — Telephone Encounter (Signed)
Telephoned patient at mobile # and discussed negative pap smear results. Next pap due in 3 years. Patient voiced understanding.

## 2012-11-18 ENCOUNTER — Encounter (HOSPITAL_COMMUNITY): Payer: Self-pay | Admitting: *Deleted

## 2012-11-18 DIAGNOSIS — R1110 Vomiting, unspecified: Secondary | ICD-10-CM | POA: Insufficient documentation

## 2012-11-18 DIAGNOSIS — F1120 Opioid dependence, uncomplicated: Secondary | ICD-10-CM | POA: Insufficient documentation

## 2012-11-18 DIAGNOSIS — F1210 Cannabis abuse, uncomplicated: Secondary | ICD-10-CM | POA: Insufficient documentation

## 2012-11-18 DIAGNOSIS — F17200 Nicotine dependence, unspecified, uncomplicated: Secondary | ICD-10-CM | POA: Insufficient documentation

## 2012-11-18 LAB — CBC WITH DIFFERENTIAL/PLATELET
Basophils Absolute: 0 10*3/uL (ref 0.0–0.1)
Eosinophils Relative: 1 % (ref 0–5)
HCT: 42.4 % (ref 36.0–46.0)
Lymphocytes Relative: 27 % (ref 12–46)
MCHC: 35.6 g/dL (ref 30.0–36.0)
MCV: 89.5 fL (ref 78.0–100.0)
Monocytes Absolute: 0.6 10*3/uL (ref 0.1–1.0)
RDW: 13.9 % (ref 11.5–15.5)

## 2012-11-18 LAB — ETHANOL: Alcohol, Ethyl (B): <11 mg/dL (ref 0–11)

## 2012-11-18 LAB — RAPID URINE DRUG SCREEN, HOSP PERFORMED
Barbiturates: NOT DETECTED
Benzodiazepines: POSITIVE — AB
Cocaine: NOT DETECTED
Opiates: POSITIVE — AB

## 2012-11-18 LAB — COMPREHENSIVE METABOLIC PANEL
AST: 17 U/L (ref 0–37)
CO2: 28 mEq/L (ref 19–32)
Calcium: 9.8 mg/dL (ref 8.4–10.5)
Creatinine, Ser: 0.73 mg/dL (ref 0.50–1.10)
GFR calc Af Amer: >90 mL/min (ref >90)
GFR calc non Af Amer: >90 mL/min (ref >90)

## 2012-11-18 MED ORDER — ONDANSETRON 4 MG PO TBDP
8.0000 mg | ORAL_TABLET | Freq: Once | ORAL | Status: AC
  Administered 2012-11-18: 8 mg via ORAL
  Filled 2012-11-18: qty 2

## 2012-11-18 NOTE — ED Notes (Signed)
Pt states she last used heroin at 13:00 this afternoon. Pt states that she wants to detox from heroin. Pt denies SI and HI thoughts. Pt states that she has been nauseated since her last use. Pt states she has used heroin for 3 years.

## 2012-11-18 NOTE — ED Notes (Signed)
Patient vomiting at this time. Patient medicated per protocol.

## 2012-11-19 ENCOUNTER — Emergency Department (HOSPITAL_COMMUNITY)
Admission: EM | Admit: 2012-11-19 | Discharge: 2012-11-20 | Discharge status: Against Medical Advice | Payer: Self-pay | Attending: Emergency Medicine | Admitting: Emergency Medicine

## 2012-11-19 ENCOUNTER — Encounter (HOSPITAL_COMMUNITY): Payer: Self-pay | Admitting: *Deleted

## 2012-11-19 ENCOUNTER — Emergency Department (HOSPITAL_COMMUNITY)
Admission: EM | Admit: 2012-11-19 | Discharge: 2012-11-19 | Discharge status: Against Medical Advice | Payer: Self-pay | Attending: Emergency Medicine | Admitting: Emergency Medicine

## 2012-11-19 DIAGNOSIS — F1110 Opioid abuse, uncomplicated: Secondary | ICD-10-CM | POA: Insufficient documentation

## 2012-11-19 DIAGNOSIS — R110 Nausea: Secondary | ICD-10-CM | POA: Insufficient documentation

## 2012-11-19 LAB — COMPREHENSIVE METABOLIC PANEL
ALT: 17 U/L (ref 0–35)
AST: 16 U/L (ref 0–37)
Alkaline Phosphatase: 79 U/L (ref 39–117)
CO2: 28 mEq/L (ref 19–32)
Chloride: 102 mEq/L (ref 96–112)
GFR calc Af Amer: >90 mL/min (ref >90)
GFR calc non Af Amer: >90 mL/min (ref >90)
Glucose, Bld: 74 mg/dL (ref 70–99)
Potassium: 3.7 mEq/L (ref 3.5–5.1)
Sodium: 140 mEq/L (ref 135–145)
Total Bilirubin: 0.3 mg/dL (ref 0.3–1.2)

## 2012-11-19 LAB — CBC WITH DIFFERENTIAL/PLATELET
Eosinophils Relative: 2 % (ref 0–5)
Lymphocytes Relative: 34 % (ref 12–46)
Lymphs Abs: 3.2 10*3/uL (ref 0.7–4.0)
MCV: 90.6 fL (ref 78.0–100.0)
Neutro Abs: 5.5 10*3/uL (ref 1.7–7.7)
Neutrophils Relative %: 57 % (ref 43–77)
Platelets: 334 10*3/uL (ref 150–400)
RBC: 4.8 MIL/uL (ref 3.87–5.11)
WBC: 9.5 10*3/uL (ref 4.0–10.5)

## 2012-11-19 NOTE — ED Notes (Signed)
Pt came yesterday and had a family emergency that made her leave AMA. Pt is back today for herion detox. Pt used last this morning. Pt states that she is nauseated and also she is having bilateral leg pain. Pt denies SI and HI.

## 2012-11-19 NOTE — ED Notes (Signed)
Pt stated she had a family emergency and needs to leave.  "There is a problem with my kids and I have to go.  I promise I will be back tomorrow for detox.  I really need it." RN explained to pt about her leaving AMA and pt verbalized understanding of it. E-signature obtained for elopement.  Resps e/u upon discharge.

## 2012-11-20 NOTE — ED Notes (Signed)
Pt called from waiting room for room with no answer

## 2012-11-23 ENCOUNTER — Telehealth: Payer: Self-pay

## 2012-11-23 DIAGNOSIS — N921 Excessive and frequent menstruation with irregular cycle: Secondary | ICD-10-CM

## 2012-11-23 MED ORDER — NORGESTIMATE-ETH ESTRADIOL 0.25-35 MG-MCG PO TABS: 1.0000 | ORAL_TABLET | Freq: Every day | ORAL | Status: DC

## 2012-11-23 NOTE — Addendum Note (Signed)
Addended by: Soyla Murphy T on: 11/23/2012 04:23 PM   Modules accepted: Orders

## 2012-11-23 NOTE — Telephone Encounter (Signed)
Patient called requesting a medication refill.  She did not include the name of the medication in her telephone message.  I called patient and left a message for her to call us back with more information.

## 2012-11-23 NOTE — Telephone Encounter (Signed)
I spoke with the patient.  She requested a refill for OCP's and xanax.  I informed her that she had refills available at her pharmacy for the OCP's and that this office is unable to refill her prescription for xanax and that she needs to seek a PCP.

## 2012-11-23 NOTE — Telephone Encounter (Signed)
RX sent to pharmacy at [facility] on [address] per patient request.

## 2012-11-27 ENCOUNTER — Telehealth: Payer: Self-pay | Admitting: Obstetrics and Gynecology

## 2012-11-27 NOTE — Telephone Encounter (Signed)
Patient called stating that she needs refill for xanax. Advised that according to previous note and advices; she will need to seek care from a PCP for xanax Rx. Gave [facility] information for establishing PCP. Patient agrees and satisfied.

## 2013-02-16 ENCOUNTER — Encounter (HOSPITAL_COMMUNITY): Payer: Self-pay | Admitting: Emergency Medicine

## 2013-02-16 ENCOUNTER — Emergency Department (HOSPITAL_COMMUNITY)
Admission: EM | Admit: 2013-02-16 | Discharge: 2013-02-18 | Disposition: A | Discharge status: Home / Self Care | Payer: Self-pay | Attending: Emergency Medicine | Admitting: Emergency Medicine

## 2013-02-16 DIAGNOSIS — M79609 Pain in unspecified limb: Secondary | ICD-10-CM | POA: Insufficient documentation

## 2013-02-16 DIAGNOSIS — Z8669 Personal history of other diseases of the nervous system and sense organs: Secondary | ICD-10-CM | POA: Insufficient documentation

## 2013-02-16 DIAGNOSIS — F1410 Cocaine abuse, uncomplicated: Secondary | ICD-10-CM | POA: Insufficient documentation

## 2013-02-16 DIAGNOSIS — F1110 Opioid abuse, uncomplicated: Secondary | ICD-10-CM | POA: Insufficient documentation

## 2013-02-16 DIAGNOSIS — Z87898 Personal history of other specified conditions: Secondary | ICD-10-CM

## 2013-02-16 DIAGNOSIS — R110 Nausea: Secondary | ICD-10-CM | POA: Insufficient documentation

## 2013-02-16 DIAGNOSIS — Z3202 Encounter for pregnancy test, result negative: Secondary | ICD-10-CM | POA: Insufficient documentation

## 2013-02-16 DIAGNOSIS — F17200 Nicotine dependence, unspecified, uncomplicated: Secondary | ICD-10-CM | POA: Insufficient documentation

## 2013-02-16 DIAGNOSIS — F410 Panic disorder [episodic paroxysmal anxiety] without agoraphobia: Secondary | ICD-10-CM | POA: Insufficient documentation

## 2013-02-16 DIAGNOSIS — IMO0002 Reserved for concepts with insufficient information to code with codable children: Secondary | ICD-10-CM | POA: Insufficient documentation

## 2013-02-16 DIAGNOSIS — Z79899 Other long term (current) drug therapy: Secondary | ICD-10-CM | POA: Insufficient documentation

## 2013-02-16 LAB — COMPREHENSIVE METABOLIC PANEL
AST: 17 U/L (ref 0–37)
Albumin: 3.8 g/dL (ref 3.5–5.2)
BUN: 8 mg/dL (ref 6–23)
Calcium: 9.5 mg/dL (ref 8.4–10.5)
Creatinine, Ser: 0.55 mg/dL (ref 0.50–1.10)
GFR calc non Af Amer: >90 mL/min (ref >90)
Total Bilirubin: 0.3 mg/dL (ref 0.3–1.2)

## 2013-02-16 LAB — SALICYLATE LEVEL: Salicylate Lvl: <2 mg/dL — ABNORMAL LOW (ref 2.8–20.0)

## 2013-02-16 LAB — CBC
HCT: 41.4 % (ref 36.0–46.0)
MCH: 29.2 pg (ref 26.0–34.0)
MCV: 88.8 fL (ref 78.0–100.0)
Platelets: 301 10*3/uL (ref 150–400)
RDW: 13.8 % (ref 11.5–15.5)

## 2013-02-16 LAB — ETHANOL: Alcohol, Ethyl (B): <11 mg/dL (ref 0–11)

## 2013-02-16 LAB — POCT PREGNANCY, URINE: Preg Test, Ur: NEGATIVE

## 2013-02-16 LAB — RAPID URINE DRUG SCREEN, HOSP PERFORMED
Amphetamines: NOT DETECTED
Opiates: POSITIVE — AB

## 2013-02-16 MED ORDER — HYDROXYZINE HCL 25 MG PO TABS
25.0000 mg | ORAL_TABLET | Freq: Four times a day (QID) | ORAL | Status: DC | PRN
  Administered 2013-02-16 – 2013-02-17 (×5): 25 mg via ORAL
  Filled 2013-02-16 (×5): qty 1

## 2013-02-16 MED ORDER — IBUPROFEN 400 MG PO TABS
600.0000 mg | ORAL_TABLET | Freq: Three times a day (TID) | ORAL | Status: DC | PRN
  Administered 2013-02-16 – 2013-02-17 (×4): 600 mg via ORAL
  Filled 2013-02-16 (×4): qty 1

## 2013-02-16 MED ORDER — DICYCLOMINE HCL 20 MG PO TABS
20.0000 mg | ORAL_TABLET | Freq: Four times a day (QID) | ORAL | Status: DC | PRN
  Administered 2013-02-16 – 2013-02-17 (×3): 20 mg via ORAL
  Filled 2013-02-16 (×3): qty 1

## 2013-02-16 MED ORDER — NAPROXEN 250 MG PO TABS
500.0000 mg | ORAL_TABLET | Freq: Two times a day (BID) | ORAL | Status: DC | PRN
  Administered 2013-02-17: 500 mg via ORAL
  Filled 2013-02-16: qty 2

## 2013-02-16 MED ORDER — METHOCARBAMOL 500 MG PO TABS
500.0000 mg | ORAL_TABLET | Freq: Three times a day (TID) | ORAL | Status: DC | PRN
  Administered 2013-02-16 – 2013-02-17 (×4): 500 mg via ORAL
  Filled 2013-02-16 (×4): qty 1

## 2013-02-16 MED ORDER — LORAZEPAM 1 MG PO TABS
1.0000 mg | ORAL_TABLET | Freq: Three times a day (TID) | ORAL | Status: DC | PRN
  Administered 2013-02-16 – 2013-02-18 (×6): 1 mg via ORAL
  Filled 2013-02-16: qty 2
  Filled 2013-02-16 (×5): qty 1

## 2013-02-16 MED ORDER — LOPERAMIDE HCL 2 MG PO CAPS: 2.0000 mg | ORAL_CAPSULE | ORAL | Status: DC | PRN

## 2013-02-16 MED ORDER — ONDANSETRON 4 MG PO TBDP
4.0000 mg | ORAL_TABLET | Freq: Four times a day (QID) | ORAL | Status: DC | PRN
  Administered 2013-02-16: 4 mg via ORAL
  Filled 2013-02-16 (×2): qty 1

## 2013-02-16 MED ORDER — NORGESTIMATE-ETH ESTRADIOL 0.25-35 MG-MCG PO TABS: 1.0000 | ORAL_TABLET | Freq: Every day | ORAL | Status: DC

## 2013-02-16 MED ORDER — ALUM & MAG HYDROXIDE-SIMETH 200-200-20 MG/5ML PO SUSP: 30.0000 mL | ORAL | Status: DC | PRN

## 2013-02-16 MED ORDER — CLONIDINE HCL 0.1 MG PO TABS: 0.1000 mg | ORAL_TABLET | Freq: Every day | ORAL | Status: DC

## 2013-02-16 MED ORDER — NICOTINE 21 MG/24HR TD PT24
21.0000 mg | MEDICATED_PATCH | Freq: Every day | TRANSDERMAL | Status: DC
Start: 2013-02-16 — End: 2013-02-18
  Administered 2013-02-17 – 2013-02-18 (×2): 21 mg via TRANSDERMAL
  Filled 2013-02-16 (×2): qty 1

## 2013-02-16 MED ORDER — PROMETHAZINE HCL 25 MG PO TABS
25.0000 mg | ORAL_TABLET | Freq: Four times a day (QID) | ORAL | Status: DC | PRN
  Administered 2013-02-16: 25 mg via ORAL
  Filled 2013-02-16: qty 1

## 2013-02-16 MED ORDER — ACETAMINOPHEN 325 MG PO TABS: 650.0000 mg | ORAL_TABLET | ORAL | Status: DC | PRN

## 2013-02-16 MED ORDER — PROMETHAZINE HCL 25 MG/ML IJ SOLN
25.0000 mg | Freq: Three times a day (TID) | INTRAMUSCULAR | Status: DC | PRN
  Administered 2013-02-16: 25 mg via INTRAMUSCULAR
  Filled 2013-02-16: qty 1

## 2013-02-16 MED ORDER — AMITRIPTYLINE HCL 25 MG PO TABS
50.0000 mg | ORAL_TABLET | Freq: Every day | ORAL | Status: DC
  Administered 2013-02-17: 50 mg via ORAL
  Filled 2013-02-16: qty 2
  Filled 2013-02-16: qty 1
  Filled 2013-02-16: qty 2

## 2013-02-16 MED ORDER — CLONIDINE HCL 0.1 MG PO TABS
0.1000 mg | ORAL_TABLET | ORAL | Status: DC
  Administered 2013-02-18: 0.1 mg via ORAL
  Filled 2013-02-16 (×2): qty 1

## 2013-02-16 MED ORDER — CLONIDINE HCL 0.1 MG PO TABS
0.1000 mg | ORAL_TABLET | Freq: Four times a day (QID) | ORAL | Status: AC
  Administered 2013-02-16 – 2013-02-18 (×6): 0.1 mg via ORAL
  Filled 2013-02-16 (×5): qty 1

## 2013-02-16 NOTE — ED Notes (Signed)
Last used heroin around 9 pm. States used a dime. States uses 2-3 30 dollar bags a day..states has been using around 4 year. Was at [place] for detox around 2 years ago. States did go to [facility] sometime in the past 2 years. States was only able to stay clean around 2 days. States she she "planned" this trip to detox so she can get clean to see her child. States her baby is 23 years old. He is with her cousin. States no pending court dates.

## 2013-02-16 NOTE — ED Notes (Signed)
Pt c/o leg cramps

## 2013-02-16 NOTE — ED Notes (Signed)
Pt states she has nausea. Vomited this am. C/O pain bilateral thighs and aching all over.

## 2013-02-16 NOTE — ED Notes (Signed)
Pt retching. Lab in to draw blood.

## 2013-02-16 NOTE — ED Notes (Signed)
Pt states "I feel like shit!" but states her nausea has subsided.

## 2013-02-16 NOTE — ED Notes (Signed)
Pt continues to gag occasionally so was given zofran.

## 2013-02-16 NOTE — BHH Counselor (Signed)
Pt has been referred to [facility], but is refusing admission there "I don't like it there.  We can't even get a Tylenol."  Pt has been there previously and did not receive appropriate care, per report.  [facility] currently has no beds.

## 2013-02-16 NOTE — ED Notes (Signed)
Pt states she has been using heroin for three years. Pt states she also takes xanax. Pt states she last used heroin yesterday at 900 pm. Pt states she is nauseous and has vomited. Pt states she has been treated in the past through [facility]. No SI or HI. Pt states she has pain bilaterally in her legs. Pt states she also uses marijuana.

## 2013-02-16 NOTE — ED Provider Notes (Signed)
History     CSN: [id number]  Arrival date & time 02/16/13  1020   First MD Initiated Contact with Patient 02/16/13 1030      Chief Complaint  Patient presents with  . Medical Clearance    (Consider location/radiation/quality/duration/timing/severity/associated sxs/prior treatment) HPI  Avonna Iribe is a 22 y.o. female requesting detox from IV heroin. Patient states she's been using for 3 years. She states that she has tried to detox before at [facility] that was unsuccessful. She cannot say why she is choosing to detox today. She simply says that she's been planning it for a while. She denies any alcohol abuse. She states that she also occasionally uses marijuana. She denies suicidal ideation, homicidal ideation. She states that she's having nausea and pain in the bilateral legs. Patient last used heroin at 9 PM yesterday. States she has a history of seizure disorder, has had 3 seizures in her life last one was over 5 years ago, she states that she does not take any antiseizure medications at this time. Patient denies fever, back pain, chest pain, shortness of breath, abdominal pain, change in urinary or bowel habits  Past Medical History  Diagnosis Date  . Panic attack   . Heroin addiction     x 3 years  . Seizures     Past Surgical History  Procedure Laterality Date  . Appendectomy      Family History  Problem Relation Age of Onset  . Cancer Maternal Grandmother     breast    History  Substance Use Topics  . Smoking status: Current Every Day Smoker -- 1.00 packs/day for 11 years    Types: Cigarettes  . Smokeless tobacco: Not on file  . Alcohol Use: No     Comment: OCCASIONAL    OB History   Grav Para Term Preterm Abortions TAB SAB Ect Mult Living   4 2 2  2 2    2       Review of Systems  Constitutional: Negative for fever.  Respiratory: Negative for shortness of breath.   Cardiovascular: Negative for chest pain.  Gastrointestinal: Positive for nausea. Negative for  vomiting, abdominal pain and diarrhea.  Musculoskeletal: Positive for arthralgias.  All other systems reviewed and are negative.    Allergies  Review of patient's allergies indicates no known allergies.  Home Medications   Current Outpatient Rx  Name  Route  Sig  Dispense  Refill  . ALPRAZolam (XANAX) 1 MG tablet   Oral   Take 1 tablet (1 mg total) by mouth at bedtime as needed for sleep.   30 tablet   0   . amitriptyline (ELAVIL) 50 MG tablet   Oral   Take 50 mg by mouth at bedtime.         . norgestimate-ethinyl estradiol (ORTHO-CYCLEN,SPRINTEC,PREVIFEM) 0.25-35 MG-MCG tablet   Oral   Take 1 tablet by mouth daily.   1 Package   11     BP 121/75  Pulse 85  Temp(Src) 98.5 F (36.9 C) (Oral)  Resp 18  Ht 5\' 8"  (1.727 m)  Wt 148 lb (67.132 kg)  BMI 22.51 kg/m2  SpO2 98%  LMP 02/09/2013  Physical Exam  Nursing note and vitals reviewed. Constitutional: She is oriented to person, place, and time. She appears well-developed and well-nourished. No distress.  HENT:  Head: Normocephalic.  Mouth/Throat: Oropharynx is clear and moist.  Eyes: Conjunctivae and EOM are normal. Pupils are equal, round, and reactive to light.  Neck: Normal range  of motion. Neck supple.  Cardiovascular: Normal rate and regular rhythm.   Pulmonary/Chest: Effort normal and breath sounds normal. No stridor. No respiratory distress. She has no wheezes. She has no rales. She exhibits no tenderness.  Abdominal: Soft. There is no tenderness.  Musculoskeletal: Normal range of motion.  Neurological: She is alert and oriented to person, place, and time.  Skin:  Injection marks to left antecubital space, no signs of infection  Psychiatric:  Mild agitation    ED Course  Procedures (including critical care time)  Labs Reviewed  COMPREHENSIVE METABOLIC PANEL - Abnormal; Notable for the following:    Glucose, Bld 100 (*)    All other components within normal limits  SALICYLATE LEVEL - Abnormal;  Notable for the following:    Salicylate Lvl <2.0 (*)    All other components within normal limits  URINE RAPID DRUG SCREEN (HOSP PERFORMED) - Abnormal; Notable for the following:    Opiates POSITIVE (*)    Tetrahydrocannabinol POSITIVE (*)    All other components within normal limits  ACETAMINOPHEN LEVEL  CBC  ETHANOL  POCT PREGNANCY, URINE   No results found.   1. Heroin abuse   2. Tobacco use disorder   3. History of seizures       MDM   Filed Vitals:   02/16/13 1025 02/16/13 1500  BP: 121/75 102/62  Pulse: 85 74  Temp: 98.5 F (36.9 C) 97.7 F (36.5 C)  TempSrc: Oral Oral  Resp: 18 20  Height: 5\' 8"  (1.727 m)   Weight: 148 lb (67.132 kg)   SpO2: 98% 97%     Krisann Mckenna is a 22 y.o. female questing detox for IV heroin abuse. Patient's blood work, urine pregnancy and urinalysis are concerning. Urine drug screen is positive for opiates and THC is expected.  Patient is medically cleared for psychiatric evaluation.  Home medications ordered, clonidine withdrawal orders initiated, Zofran changed to Phenergan as per patient preference,  psych holding orders placed, act team consulted.   Medications  dicyclomine (BENTYL) tablet 20 mg (not administered)  hydrOXYzine (ATARAX/VISTARIL) tablet 25 mg (not administered)  loperamide (IMODIUM) capsule 2-4 mg (not administered)  methocarbamol (ROBAXIN) tablet 500 mg (not administered)  naproxen (NAPROSYN) tablet 500 mg (not administered)  ondansetron (ZOFRAN-ODT) disintegrating tablet 4 mg (not administered)  cloNIDine (CATAPRES) tablet 0.1 mg (not administered)    Followed by  cloNIDine (CATAPRES) tablet 0.1 mg (not administered)    Followed by  cloNIDine (CATAPRES) tablet 0.1 mg (not administered)  promethazine (PHENERGAN) tablet 25 mg (25 mg Oral Given 02/16/13 1118)  acetaminophen (TYLENOL) tablet 650 mg (not administered)  ibuprofen (ADVIL,MOTRIN) tablet 600 mg (not administered)  nicotine (NICODERM CQ - dosed in  mg/24 hours) patch 21 mg (not administered)  alum & mag hydroxide-simeth (MAALOX/MYLANTA) 200-200-20 MG/5ML suspension 30 mL (not administered)       Wynetta Emery, PA-C 02/16/13 1544  Charlies Rayburn, PA-C 02/16/13 1544

## 2013-02-16 NOTE — ED Notes (Signed)
Pt states Zofran does not work for her. Requesting Phenergan.

## 2013-02-16 NOTE — BH Assessment (Signed)
BHH Assessment Progress Note   This clinician spoke to Shannon Morrison at [facility] and procured sponsorship for patient.  Patient and clinician talked about [facility] accepting her.  Patient said that she would go ahead and go there even though she does not like it there.  Patient will need to go to [facility] in the morning (06/07) once charge nurse can get transportation arranged either by taxi or bus/train voucher.  Nurses are to call report to [facility] when patient leaves, their number is [phone].

## 2013-02-16 NOTE — ED Notes (Signed)
Pt in blue scrubs

## 2013-02-16 NOTE — ED Notes (Signed)
Pt wanded by security. 

## 2013-02-17 NOTE — ED Notes (Signed)
Pt. Denies wanting zofran or phenergan at this time for her nausea.

## 2013-02-17 NOTE — Clinical Social Work Note (Signed)
Clinical Social Worker received phone call regarding transportation needs to [facility] in [place].  CSW spoke with patient at bedside who states she had been accepted and needed transportation.  CSW contacted [facility] who states that patient was declined due to a perceived seizure disorder.  CSW pursued alternative of [facility], however they do not have detox beds at this time.  CSW spoke with [facility] who does not complete weekend admissions but would be willing to address patient needs for possible admission first thing Monday morning - patient/RN aware.  Patient to contact family to update.  CSW will provide weekday CSW with information to facilitate patient discharge needs.  Macario Golds, [place] [phone]   (Weekend Coverage)

## 2013-02-17 NOTE — ED Provider Notes (Signed)
Medical screening examination/treatment/procedure(s) were performed by non-physician practitioner and as supervising physician I was immediately available for consultation/collaboration.    Vida Roller, MD 02/17/13 [phone]

## 2013-02-17 NOTE — BHH Counselor (Signed)
Pt declined [facility] due to seizure disorder. Denice Bors, AADC 02/17/2013 1:21 PM

## 2013-02-18 NOTE — ED Notes (Signed)
Pt states she is ready to be discharged-- doesn't have anyone to take care of her children, but will follow up at [facility] tomorrow

## 2013-02-18 NOTE — ED Notes (Signed)
Door open, pt visualized, pt sleeping, NAD, calm, no changes.

## 2013-02-18 NOTE — ED Notes (Signed)
Pt visualized, sleeping, NAD, calm.

## 2013-02-18 NOTE — ED Notes (Signed)
Door open, pt visualized, no changes, pt sleeping, NAD, calm.

## 2013-03-14 ENCOUNTER — Emergency Department (HOSPITAL_COMMUNITY)
Admission: EM | Admit: 2013-03-14 | Discharge: 2013-03-14 | Discharge status: Against Medical Advice | Payer: Self-pay | Attending: Emergency Medicine | Admitting: Emergency Medicine

## 2013-03-14 ENCOUNTER — Encounter (HOSPITAL_COMMUNITY): Payer: Self-pay | Admitting: Emergency Medicine

## 2013-03-14 DIAGNOSIS — R197 Diarrhea, unspecified: Secondary | ICD-10-CM | POA: Insufficient documentation

## 2013-03-14 DIAGNOSIS — Z79899 Other long term (current) drug therapy: Secondary | ICD-10-CM | POA: Insufficient documentation

## 2013-03-14 DIAGNOSIS — F410 Panic disorder [episodic paroxysmal anxiety] without agoraphobia: Secondary | ICD-10-CM | POA: Insufficient documentation

## 2013-03-14 DIAGNOSIS — F17200 Nicotine dependence, unspecified, uncomplicated: Secondary | ICD-10-CM | POA: Insufficient documentation

## 2013-03-14 DIAGNOSIS — R109 Unspecified abdominal pain: Secondary | ICD-10-CM | POA: Insufficient documentation

## 2013-03-14 DIAGNOSIS — R569 Unspecified convulsions: Secondary | ICD-10-CM | POA: Insufficient documentation

## 2013-03-14 DIAGNOSIS — F1410 Cocaine abuse, uncomplicated: Secondary | ICD-10-CM | POA: Insufficient documentation

## 2013-03-14 DIAGNOSIS — F1120 Opioid dependence, uncomplicated: Secondary | ICD-10-CM | POA: Insufficient documentation

## 2013-03-14 DIAGNOSIS — Z3202 Encounter for pregnancy test, result negative: Secondary | ICD-10-CM | POA: Insufficient documentation

## 2013-03-14 DIAGNOSIS — R112 Nausea with vomiting, unspecified: Secondary | ICD-10-CM | POA: Insufficient documentation

## 2013-03-14 DIAGNOSIS — IMO0001 Reserved for inherently not codable concepts without codable children: Secondary | ICD-10-CM | POA: Insufficient documentation

## 2013-03-14 DIAGNOSIS — F1110 Opioid abuse, uncomplicated: Secondary | ICD-10-CM

## 2013-03-14 LAB — CBC WITH DIFFERENTIAL/PLATELET
Eosinophils Relative: 0 % (ref 0–5)
HCT: 44.5 % (ref 36.0–46.0)
Lymphocytes Relative: 22 % (ref 12–46)
Lymphs Abs: 2.4 10*3/uL (ref 0.7–4.0)
MCV: 89.2 fL (ref 78.0–100.0)
Monocytes Absolute: 0.3 10*3/uL (ref 0.1–1.0)
Monocytes Relative: 3 % (ref 3–12)
RBC: 4.99 MIL/uL (ref 3.87–5.11)
WBC: 10.9 10*3/uL — ABNORMAL HIGH (ref 4.0–10.5)

## 2013-03-14 LAB — RAPID URINE DRUG SCREEN, HOSP PERFORMED
Barbiturates: NOT DETECTED
Benzodiazepines: NOT DETECTED
Cocaine: POSITIVE — AB
Tetrahydrocannabinol: POSITIVE — AB

## 2013-03-14 LAB — ETHANOL: Alcohol, Ethyl (B): <11 mg/dL (ref 0–11)

## 2013-03-14 LAB — COMPREHENSIVE METABOLIC PANEL
AST: 18 U/L (ref 0–37)
Albumin: 4 g/dL (ref 3.5–5.2)
BUN: 12 mg/dL (ref 6–23)
Creatinine, Ser: 0.61 mg/dL (ref 0.50–1.10)
Potassium: 3.8 mEq/L (ref 3.5–5.1)
Total Protein: 8.3 g/dL (ref 6.0–8.3)

## 2013-03-14 MED ORDER — PROMETHAZINE HCL 25 MG/ML IJ SOLN
25.0000 mg | Freq: Once | INTRAMUSCULAR | Status: AC
  Administered 2013-03-14: 25 mg via INTRAMUSCULAR
  Filled 2013-03-14 (×2): qty 1

## 2013-03-14 MED ORDER — CLONIDINE HCL 0.1 MG PO TABS
0.1000 mg | ORAL_TABLET | Freq: Four times a day (QID) | ORAL | Status: DC
  Administered 2013-03-14 (×4): 0.1 mg via ORAL
  Filled 2013-03-14 (×4): qty 1

## 2013-03-14 MED ORDER — LOPERAMIDE HCL 2 MG PO CAPS: 2.0000 mg | ORAL_CAPSULE | ORAL | Status: DC | PRN

## 2013-03-14 MED ORDER — HYDROXYZINE HCL 25 MG PO TABS
25.0000 mg | ORAL_TABLET | Freq: Four times a day (QID) | ORAL | Status: DC | PRN
  Administered 2013-03-14 (×2): 25 mg via ORAL
  Filled 2013-03-14 (×2): qty 1

## 2013-03-14 MED ORDER — ZOLPIDEM TARTRATE 5 MG PO TABS: 5.0000 mg | ORAL_TABLET | Freq: Every evening | ORAL | Status: DC | PRN

## 2013-03-14 MED ORDER — NICOTINE 21 MG/24HR TD PT24: 21.0000 mg | MEDICATED_PATCH | Freq: Every day | TRANSDERMAL | Status: DC | PRN

## 2013-03-14 MED ORDER — ONDANSETRON HCL 4 MG PO TABS
4.0000 mg | ORAL_TABLET | Freq: Three times a day (TID) | ORAL | Status: DC | PRN
  Administered 2013-03-14: 4 mg via ORAL
  Filled 2013-03-14: qty 1

## 2013-03-14 MED ORDER — CLONIDINE HCL 0.1 MG PO TABS: 0.1000 mg | ORAL_TABLET | Freq: Every day | ORAL | Status: DC

## 2013-03-14 MED ORDER — CLONIDINE HCL 0.1 MG PO TABS: 0.1000 mg | ORAL_TABLET | ORAL | Status: DC

## 2013-03-14 MED ORDER — LORAZEPAM 1 MG PO TABS
1.0000 mg | ORAL_TABLET | Freq: Three times a day (TID) | ORAL | Status: DC | PRN
  Administered 2013-03-14 (×2): 1 mg via ORAL
  Filled 2013-03-14 (×2): qty 1

## 2013-03-14 MED ORDER — ONDANSETRON 4 MG PO TBDP
8.0000 mg | ORAL_TABLET | Freq: Once | ORAL | Status: AC
  Administered 2013-03-14: 8 mg via ORAL
  Filled 2013-03-14: qty 2

## 2013-03-14 MED ORDER — DICYCLOMINE HCL 20 MG PO TABS: 20.0000 mg | ORAL_TABLET | Freq: Four times a day (QID) | ORAL | Status: DC | PRN

## 2013-03-14 MED ORDER — ACETAMINOPHEN 325 MG PO TABS: 650.0000 mg | ORAL_TABLET | ORAL | Status: DC | PRN

## 2013-03-14 MED ORDER — IBUPROFEN 200 MG PO TABS
400.0000 mg | ORAL_TABLET | Freq: Three times a day (TID) | ORAL | Status: DC | PRN
  Administered 2013-03-14: 400 mg via ORAL
  Filled 2013-03-14: qty 2

## 2013-03-14 MED ORDER — ALUM & MAG HYDROXIDE-SIMETH 200-200-20 MG/5ML PO SUSP: 30.0000 mL | ORAL | Status: DC | PRN

## 2013-03-14 MED ORDER — METHOCARBAMOL 500 MG PO TABS: 500.0000 mg | ORAL_TABLET | Freq: Three times a day (TID) | ORAL | Status: DC | PRN

## 2013-03-14 NOTE — ED Notes (Signed)
PT. REQUESTING DETOX FOR HEROIN ABUSE, LAST HEROIN YESTERDAY , DENIES SUICIDAL IDEATION .

## 2013-03-14 NOTE — ED Provider Notes (Signed)
History    CSN: [id number] Arrival date & time 03/14/13  0106  First MD Initiated Contact with Patient 03/14/13 0203     Chief Complaint  Patient presents with  . Drug Problem   HPI Pt was seen at 0200.  Per pt, c/o gradual onset and persistence of constant heroin abuse for the past 4 years. States she was clean for a few months after going to [facility] for detox, but relapsed approx 1 month ago after "someone in my family died."  States she uses approx 1 gram, three times per day. States her LD was yesterday.  Endorses she feels like she "is withdrawing" now; having N/V/D, abd "cramps," and generalized body aches. Pt is requesting detox.  Denies SI, no SA, no HI.   Past Medical History  Diagnosis Date  . Panic attack   . Heroin addiction     x 3 years  . Seizures    Past Surgical History  Procedure Laterality Date  . Appendectomy     Family History  Problem Relation Age of Onset  . Cancer Maternal Grandmother     breast   History  Substance Use Topics  . Smoking status: Current Every Day Smoker -- 1.00 packs/day for 11 years    Types: Cigarettes  . Smokeless tobacco: Not on file  . Alcohol Use: No     Comment: OCCASIONAL   OB History   Grav Para Term Preterm Abortions TAB SAB Ect Mult Living   4 2 2  2 2    2      Review of Systems ROS: Statement: All systems negative except as marked or noted in the HPI; Constitutional: Negative for fever and chills. +body aches.; ; Eyes: Negative for eye pain, redness and discharge. ; ; ENMT: Negative for ear pain, hoarseness, nasal congestion, sinus pressure and sore throat. ; ; Cardiovascular: Negative for chest pain, palpitations, diaphoresis, dyspnea and peripheral edema. ; ; Respiratory: Negative for cough, wheezing and stridor. ; ; Gastrointestinal: +N/V/D, abd cramping. Negative for abdominal pain, blood in stool, hematemesis, jaundice and rectal bleeding. . ; ; Genitourinary: Negative for dysuria, flank pain and hematuria. ; ;  Musculoskeletal: Negative for back pain and neck pain. Negative for swelling and trauma.; ; Skin: Negative for pruritus, rash, abrasions, blisters, bruising and skin lesion.; ; Neuro: Negative for headache, lightheadedness and neck stiffness. Negative for weakness, altered level of consciousness , altered mental status, extremity weakness, paresthesias, involuntary movement, seizure and syncope.; Psych:  No SI, no SA, no HI, no hallucinations.     Allergies  Review of patient's allergies indicates no known allergies.  Home Medications   Current Outpatient Rx  Name  Route  Sig  Dispense  Refill  . ALPRAZolam (XANAX) 1 MG tablet   Oral   Take 1 mg by mouth daily.         Marland Kitchen amitriptyline (ELAVIL) 50 MG tablet   Oral   Take 50 mg by mouth at bedtime.         . norgestimate-ethinyl estradiol (ORTHO-CYCLEN,SPRINTEC,PREVIFEM) 0.25-35 MG-MCG tablet   Oral   Take 1 tablet by mouth daily.   1 Package   11    BP 128/84  Pulse 94  Temp(Src) 98.7 F (37.1 C) (Oral)  Resp 16  SpO2 99%  LMP 02/09/2013  Physical Exam 0205: Physical examination:  Nursing notes reviewed; Vital signs and O2 SAT reviewed;  Constitutional: Well developed, Well nourished, Well hydrated, In no acute distress; Head:  Normocephalic, atraumatic;  Eyes: EOMI, PERRL, No scleral icterus; ENMT: Mouth and pharynx normal, Mucous membranes moist; Neck: Supple, Full range of motion, No lymphadenopathy; Cardiovascular: Regular rate and rhythm, No murmur, rub, or gallop; Respiratory: Breath sounds clear & equal bilaterally, No rales, rhonchi, wheezes.  Speaking full sentences with ease, Normal respiratory effort/excursion; Chest: Nontender, Movement normal; Abdomen: Soft, Nontender, Nondistended, Normal bowel sounds; Genitourinary: No CVA tenderness; Extremities: Pulses normal, No tenderness, No edema, No calf edema or asymmetry.; Neuro: AA&Ox3, Major CN grossly intact.  Speech clear. No gross focal motor or sensory deficits in  extremities.; Skin: Color normal, Warm, Dry.; Psych:  Denies SI, calm/cooperative.    ED Course  Procedures    MDM  MDM Reviewed: previous chart, nursing note and vitals Reviewed previous: labs Interpretation: labs   Results for orders placed during the hospital encounter of 03/14/13  URINE RAPID DRUG SCREEN (HOSP PERFORMED)      Result Value Range   Opiates POSITIVE (*) NONE DETECTED   Cocaine POSITIVE (*) NONE DETECTED   Benzodiazepines NONE DETECTED  NONE DETECTED   Amphetamines NONE DETECTED  NONE DETECTED   Tetrahydrocannabinol POSITIVE (*) NONE DETECTED   Barbiturates NONE DETECTED  NONE DETECTED  ETHANOL      Result Value Range   Alcohol, Ethyl (B) <11  0 - 11 mg/dL  COMPREHENSIVE METABOLIC PANEL      Result Value Range   Sodium 137  135 - 145 mEq/L   Potassium 3.8  3.5 - 5.1 mEq/L   Chloride 100  96 - 112 mEq/L   CO2 24  19 - 32 mEq/L   Glucose, Bld 99  70 - 99 mg/dL   BUN 12  6 - 23 mg/dL   Creatinine, Ser 1.61  0.50 - 1.10 mg/dL   Calcium 9.4  8.4 - 09.6 mg/dL   Total Protein 8.3  6.0 - 8.3 g/dL   Albumin 4.0  3.5 - 5.2 g/dL   AST 18  0 - 37 U/L   ALT 16  0 - 35 U/L   Alkaline Phosphatase 80  39 - 117 U/L   Total Bilirubin 0.4  0.3 - 1.2 mg/dL   GFR calc non Af Amer >90  >90 mL/min   GFR calc Af Amer >90  >90 mL/min  CBC WITH DIFFERENTIAL      Result Value Range   WBC 10.9 (*) 4.0 - 10.5 K/uL   RBC 4.99  3.87 - 5.11 MIL/uL   Hemoglobin 15.3 (*) 12.0 - 15.0 g/dL   HCT 04.5  40.9 - 81.1 %   MCV 89.2  78.0 - 100.0 fL   MCH 30.7  26.0 - 34.0 pg   MCHC 34.4  30.0 - 36.0 g/dL   RDW 91.4  78.2 - 95.6 %   Platelets 340  150 - 400 K/uL   Neutrophils Relative % 75  43 - 77 %   Neutro Abs 8.1 (*) 1.7 - 7.7 K/uL   Lymphocytes Relative 22  12 - 46 %   Lymphs Abs 2.4  0.7 - 4.0 K/uL   Monocytes Relative 3  3 - 12 %   Monocytes Absolute 0.3  0.1 - 1.0 K/uL   Eosinophils Relative 0  0 - 5 %   Eosinophils Absolute 0.0  0.0 - 0.7 K/uL   Basophils Relative 0  0  - 1 %   Basophils Absolute 0.0  0.0 - 0.1 K/uL  POCT PREGNANCY, URINE      Result Value Range   Preg  Test, Ur NEGATIVE  NEGATIVE    0400:  [facility] will eval for placement. Holding orders written.    Laray Anger, DO 03/14/13 [phone]

## 2013-03-14 NOTE — ED Notes (Signed)
Pt.s husband call and left his number [phone]

## 2013-03-14 NOTE — ED Notes (Signed)
Pt signed Elopement forms to leave AMA. Pt states she understands the  Risks. Pt received all her belongings and patient signed the sheet.

## 2013-03-14 NOTE — BH Assessment (Signed)
[facility] Assessment Progress Note      Received a request for admission to [facility] from ED but at this time there are no beds for the 300 hall. Will review and run when a bed becomes available.

## 2013-03-14 NOTE — BH Assessment (Signed)
[facility] Assessment Progress Note      Reviewed assessment information after writing that we would review her for admission when a bed would become available, however we do not accept admissions for heroin detox only.

## 2013-03-14 NOTE — ED Notes (Addendum)
Pt to ED for detox from [facility], pt admits to using Heroine for 4 years, states when she is able to she uses 1 gram 3 times a day.  Pt last used Heroine last night.  States she wants to stop using because "I'm sick and tired of it".  Pt states she has tried to stop using in the past but was unsuccessful.  Denies any suicidal or homicidal ideations.  Pt actively vomiting during exam, Zofran given for nausea.  Pt states her legs have been cramping since this morning.

## 2013-03-14 NOTE — BH Assessment (Addendum)
Assessment Note   Shannon Morrison is an 22 y.o. female.  Patient came to [facility] seeking detox from heroin.  She reports using IV heroin daily for the last 2.5 years.  She shoots up 1 gram daily.  Last use was around 21:00 on 06/30.  Patient uses marijuana but says it is less than once monthly.  Although cocaine was on the UDS, patient denies use and does not know how she could test positive for it.  Patient has been to [facility] before but cannot recall the dates.  She currently has no SI, HI or A/V hallucinations.  Patient has prescription medication but has not taken her amyltriptyline or birth control in 3 days.  Patient information sent to [facility] for review and possible placement for detox.  Pt has had a hx of seizures.  Upon questioning, patient says that she stopped taking her phenibarbitol at age 89 and has had two seizures (unrelated to detox) since then.  Patient was declined by [facility] because of seizure history.  Patient information has been sent to [facility] for review.  [facility] may have some beds but that will not be known until after their 09:30 meeting. Axis I: 304.0 Opioid dependence; 305.20 Cannabis abuse; 305.60 Cocaine abuse Axis II: Deferred Axis III:  Past Medical History  Diagnosis Date  . Panic attack   . Heroin addiction     x 3 years  . Seizures    Axis IV: housing problems, occupational problems and problems with access to health care services Axis V: 31-40 impairment in reality testing  Past Medical History:  Past Medical History  Diagnosis Date  . Panic attack   . Heroin addiction     x 3 years  . Seizures     Past Surgical History  Procedure Laterality Date  . Appendectomy      Family History:  Family History  Problem Relation Age of Onset  . Cancer Maternal Grandmother     breast    Social History:  reports that she has been smoking Cigarettes.  She has a 11 pack-year smoking history. She does not have any smokeless tobacco history on file. She reports that she uses illicit  drugs (Marijuana and Heroin). She reports that she does not drink alcohol.  Additional Social History:  Alcohol / Drug Use Pain Medications: Abusing heroin Prescriptions: Xanax 1mg  per day; Amyltriptylene 50 mg per day (has not had in 3 days); birth control Over the Counter: Unknown History of alcohol / drug use?: Yes Negative Consequences of Use: Personal relationships Withdrawal Symptoms: Agitation;Cramps;Diarrhea;Fever / Chills;Irritability;Nausea / Vomiting;Patient aware of relationship between substance abuse and physical/medical complications;Sweats;Tremors;Weakness Substance #1 Name of Substance 1: Heroin 1 - Age of First Use: 22 years of age 51 - Amount (size/oz): 1 gram per day IV 1 - Frequency: Daily use for past 2 years 1 - Duration: Two years 1 - Last Use / Amount: 06/30  around 21:30 Substance #2 Name of Substance 2: Marijuana 2 - Age of First Use: 22 years of age 22 - Amount (size/oz): One blunt 2 - Frequency: <1x/M 2 - Duration: On-going 2 - Last Use / Amount: Pt cannot remember Substance #3 Name of Substance 3: Cocaine 3 - Age of First Use: 22 years of age 72 - Amount (size/oz): Pt reports that she does not use but it is on her UDS 3 - Frequency: Unknown 3 - Duration: Unknown 3 - Last Use / Amount: Pt denies use  CIWA: CIWA-Ar BP: 128/84 mmHg Pulse Rate:  94 COWS: Clinical Opiate Withdrawal Scale (COWS) Resting Pulse Rate: Pulse Rate 81-100 Sweating: Flushed or Observable moistness on face Restlessness: Reports difficulty sitting still, but is able to do so Pupil Size: Pupils pinned or normal size for room light Bone or Joint Aches: Mild diffuse discomfort Runny Nose or Tearing: Not present GI Upset: Vomiting or diarrhea Tremor: Slight tremor observable Yawning: No yawning Anxiety or Irritability: Patient reports increasing irritability or anxiousness Gooseflesh Skin: Piloerection of skin can be felt or hairs standing up on arms COWS Total Score:  14  Allergies: No Known Allergies  Home Medications:  (Not in a hospital admission)  OB/GYN Status:  Patient's last menstrual period was 02/09/2013.  General Assessment Data Location of Assessment: [facility] ED Living Arrangements: Non-relatives/Friends Can pt return to current living arrangement?: Yes Admission Status: Voluntary Is patient capable of signing voluntary admission?: Yes Transfer from: Acute Hospital Referral Source: Self/Family/Friend  Education Status Highest grade of school patient has completed: 9th grade  Risk to self Suicidal Ideation: No Suicidal Intent: No Is patient at risk for suicide?: No Suicidal Plan?: No Access to Means: No What has been your use of drugs/alcohol within the last 12 months?: Heroin use daily Previous Attempts/Gestures: No How many times?: 0 Other Self Harm Risks: SA issues Triggers for Past Attempts: None known Intentional Self Injurious Behavior: None Family Suicide History: No Recent stressful life event(s):  (None reported) Persecutory voices/beliefs?: No Depression: Yes Depression Symptoms: Despondent;Insomnia;Isolating;Loss of interest in usual pleasures Substance abuse history and/or treatment for substance abuse?: Yes Suicide prevention information given to non-admitted patients: Not applicable  Risk to Others Homicidal Ideation: No Thoughts of Harm to Others: No Current Homicidal Intent: No Current Homicidal Plan: No Access to Homicidal Means: No Identified Victim: No one History of harm to others?: No Assessment of Violence: None Noted Violent Behavior Description: Pt calm and cooperative Does patient have access to weapons?: No Criminal Charges Pending?: No Does patient have a court date: No  Psychosis Hallucinations: None noted Delusions: None noted  Mental Status Report Appear/Hygiene: Disheveled;Poor hygiene Eye Contact: Good Motor Activity: Freedom of movement;Restlessness Speech: Logical/coherent Level  of Consciousness: Alert Mood: Depressed;Anxious Affect: Depressed Anxiety Level: Panic Attacks Panic attack frequency: Varies Most recent panic attack: Yesterday Thought Processes: Coherent;Relevant Judgement: Unimpaired Orientation: Person;Place;Time;Situation Obsessive Compulsive Thoughts/Behaviors: None  Cognitive Functioning Concentration: Normal Memory: Recent Impaired;Remote Impaired IQ: Average Insight: Fair Impulse Control: Poor Appetite: Good Weight Loss: 0 Weight Gain: 0 Sleep: No Change Total Hours of Sleep: 6 Vegetative Symptoms: None  ADLScreening [facility]) Patient's cognitive ability adequate to safely complete daily activities?: Yes Patient able to express need for assistance with ADLs?: Yes Independently performs ADLs?: Yes (appropriate for developmental age)  Abuse/Neglect [facility]) Physical Abuse: Denies Verbal Abuse: Denies Sexual Abuse: Denies  Prior Inpatient Therapy Prior Inpatient Therapy: Yes Prior Therapy Dates: Cannot recall Prior Therapy Facilty/Provider(s): [facility] Reason for Treatment: Detox  Prior Outpatient Therapy Prior Outpatient Therapy: No Prior Therapy Dates: None Prior Therapy Facilty/Provider(s): N/A Reason for Treatment: None  ADL Screening (condition at time of admission) Patient's cognitive ability adequate to safely complete daily activities?: Yes Patient able to express need for assistance with ADLs?: Yes Independently performs ADLs?: Yes (appropriate for developmental age) Weakness of Legs: None Weakness of Arms/Hands: None       Abuse/Neglect Assessment (Assessment to be complete while patient is alone) Physical Abuse: Denies Verbal Abuse: Denies Sexual Abuse: Denies Exploitation of patient/patient's resources: Denies Self-Neglect: Denies     Merchant navy officer (  For Healthcare) Advance Directive: Patient does not have advance directive;Patient would not like information    Additional  Information 1:1 In Past 12 Months?: No CIRT Risk: No Elopement Risk: No Does patient have medical clearance?: Yes     Disposition:  Disposition Initial Assessment Completed for this Encounter: Yes Disposition of Patient: Inpatient treatment program;Referred to Type of inpatient treatment program: Adult Patient referred to: [facility]  On Site Evaluation by:   Reviewed with Physician:  Dr. Elberta Spaniel, Berna Spare Ray 03/14/2013 5:29 AM

## 2013-03-14 NOTE — ED Notes (Signed)
Pt. Having intermittent dry heaves, Waiting for phernergan

## 2013-03-14 NOTE — ED Notes (Signed)
Pt. Is nauseated, medicated per orders.

## 2013-03-14 NOTE — ED Notes (Signed)
Pt. Asking specifically for her ativan at this time. She wanted phenergan also because she states that zofran does not work for her. She has not had any episodes of vomiting since she has been here. Advised she does not have phenergan ordered.

## 2013-03-14 NOTE — ED Notes (Signed)
Nurse informed of irregular pulse between 48 and 60.

## 2013-03-14 NOTE — ED Notes (Signed)
Pt states she is NOT suicidal and do not have Homicidal thoughts or feelings. Pt states she wants to leave AMA. Pt states she does not need any kind of treatment. Act team notified of patient wanting to leave.
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
## 2013-04-08 ENCOUNTER — Emergency Department: Payer: Self-pay | Admitting: Emergency Medicine

## 2013-04-08 LAB — ETHANOL: Ethanol: <3 mg/dL

## 2013-04-08 LAB — TSH: Thyroid Stimulating Horm: 0.912 u[IU]/mL

## 2013-04-08 LAB — URINALYSIS, COMPLETE
Bilirubin,UR: NEGATIVE
Glucose,UR: NEGATIVE mg/dL
Ketone: NEGATIVE
Leukocyte Esterase: NEGATIVE
Nitrite: NEGATIVE
Ph: 5
Protein: NEGATIVE
RBC,UR: 3 /HPF
Specific Gravity: 1.021
Squamous Epithelial: 4
WBC UR: 2 /HPF

## 2013-04-08 LAB — COMPREHENSIVE METABOLIC PANEL
Albumin: 3.4 g/dL (ref 3.4–5.0)
Bilirubin,Total: 0.5 mg/dL (ref 0.2–1.0)
Calcium, Total: 8.7 mg/dL (ref 8.5–10.1)
Chloride: 107 mmol/L (ref 98–107)
Co2: 25 mmol/L (ref 21–32)
Creatinine: 0.84 mg/dL (ref 0.60–1.30)
EGFR (Non-African Amer.): >60
Glucose: 105 mg/dL — ABNORMAL HIGH (ref 65–99)
Osmolality: 278 (ref 275–301)
SGOT(AST): 20 U/L (ref 15–37)
SGPT (ALT): 24 U/L (ref 12–78)
Total Protein: 7.4 g/dL (ref 6.4–8.2)

## 2013-04-08 LAB — CBC
HGB: 12.3 g/dL (ref 12.0–16.0)
MCH: 29 pg (ref 26.0–34.0)
MCHC: 33.3 g/dL (ref 32.0–36.0)
MCV: 87 fL (ref 80–100)
Platelet: 278 10*3/uL (ref 150–440)
RBC: 4.23 10*6/uL (ref 3.80–5.20)
RDW: 13.9 % (ref 11.5–14.5)
WBC: 12.2 10*3/uL — ABNORMAL HIGH (ref 3.6–11.0)

## 2013-04-08 LAB — DRUG SCREEN, URINE
Barbiturates, Ur Screen: NEGATIVE (ref ?–200)
Methadone, Ur Screen: NEGATIVE (ref ?–300)
Phencyclidine (PCP) Ur S: NEGATIVE (ref ?–25)

## 2013-04-08 LAB — SALICYLATE LEVEL: Salicylates, Serum: 3.1 mg/dL — ABNORMAL HIGH

## 2013-04-08 LAB — TROPONIN I: Troponin-I: <0.02 ng/mL

## 2013-04-08 LAB — ACETAMINOPHEN LEVEL: Acetaminophen: <2 ug/mL

## 2013-04-13 LAB — CULTURE, BLOOD (SINGLE)

## 2013-08-27 ENCOUNTER — Encounter (HOSPITAL_COMMUNITY): Payer: Self-pay | Admitting: Emergency Medicine

## 2013-08-27 DIAGNOSIS — F17200 Nicotine dependence, unspecified, uncomplicated: Secondary | ICD-10-CM | POA: Insufficient documentation

## 2013-08-27 DIAGNOSIS — Z8669 Personal history of other diseases of the nervous system and sense organs: Secondary | ICD-10-CM | POA: Insufficient documentation

## 2013-08-27 DIAGNOSIS — S01501A Unspecified open wound of lip, initial encounter: Secondary | ICD-10-CM | POA: Insufficient documentation

## 2013-08-27 DIAGNOSIS — Z79899 Other long term (current) drug therapy: Secondary | ICD-10-CM | POA: Insufficient documentation

## 2013-08-27 DIAGNOSIS — F410 Panic disorder [episodic paroxysmal anxiety] without agoraphobia: Secondary | ICD-10-CM | POA: Insufficient documentation

## 2013-08-27 DIAGNOSIS — Z23 Encounter for immunization: Secondary | ICD-10-CM | POA: Insufficient documentation

## 2013-08-27 NOTE — ED Notes (Signed)
Patient states that she has not had a period for about 2 months did home test and it was positive.  States she is going to have an abortion.
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
## 2013-08-28 ENCOUNTER — Emergency Department (HOSPITAL_COMMUNITY)
Admission: EM | Admit: 2013-08-28 | Discharge: 2013-08-28 | Disposition: A | Discharge status: Home / Self Care | Payer: Self-pay | Attending: Emergency Medicine | Admitting: Emergency Medicine

## 2013-08-28 DIAGNOSIS — S01511A Laceration without foreign body of lip, initial encounter: Secondary | ICD-10-CM

## 2013-08-28 MED ORDER — HYDROCODONE-ACETAMINOPHEN 5-325 MG PO TABS: 1.0000 | ORAL_TABLET | Freq: Once | ORAL | Status: DC

## 2013-08-28 MED ORDER — TETANUS-DIPHTH-ACELL PERTUSSIS 5-2.5-18.5 LF-MCG/0.5 IM SUSP
0.5000 mL | Freq: Once | INTRAMUSCULAR | Status: AC
  Administered 2013-08-28: 0.5 mL via INTRAMUSCULAR
  Filled 2013-08-28: qty 0.5
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
## 2013-08-28 NOTE — ED Notes (Signed)
Pt upset in room after PA admin xylidcaine medication, Pt was hyperventilating, SPO2 100%m, respirations increased, Pt now stable will continue to monitor

## 2013-08-29 NOTE — ED Provider Notes (Signed)
Medical screening examination/treatment/procedure(s) were performed by non-physician practitioner and as supervising physician I was immediately available for consultation/collaboration.   Loren Racer, MD 08/29/13 [phone]

## 2014-01-04 ENCOUNTER — Inpatient Hospital Stay (HOSPITAL_COMMUNITY): Payer: Self-pay

## 2014-01-04 ENCOUNTER — Inpatient Hospital Stay (HOSPITAL_COMMUNITY)
Admission: AD | Admit: 2014-01-04 | Discharge: 2014-01-05 | Disposition: A | Discharge status: Home / Self Care | Payer: Self-pay | Source: Ambulatory Visit | Attending: Obstetrics & Gynecology | Admitting: Obstetrics & Gynecology

## 2014-01-04 ENCOUNTER — Encounter (HOSPITAL_COMMUNITY): Payer: Self-pay | Admitting: *Deleted

## 2014-01-04 DIAGNOSIS — O9989 Other specified diseases and conditions complicating pregnancy, childbirth and the puerperium: Principal | ICD-10-CM

## 2014-01-04 DIAGNOSIS — R109 Unspecified abdominal pain: Secondary | ICD-10-CM | POA: Insufficient documentation

## 2014-01-04 DIAGNOSIS — N200 Calculus of kidney: Secondary | ICD-10-CM

## 2014-01-04 DIAGNOSIS — O2340 Unspecified infection of urinary tract in pregnancy, unspecified trimester: Secondary | ICD-10-CM

## 2014-01-04 DIAGNOSIS — O99330 Smoking (tobacco) complicating pregnancy, unspecified trimester: Secondary | ICD-10-CM | POA: Insufficient documentation

## 2014-01-04 DIAGNOSIS — F1920 Other psychoactive substance dependence, uncomplicated: Secondary | ICD-10-CM | POA: Insufficient documentation

## 2014-01-04 DIAGNOSIS — F1120 Opioid dependence, uncomplicated: Secondary | ICD-10-CM | POA: Insufficient documentation

## 2014-01-04 DIAGNOSIS — O99891 Other specified diseases and conditions complicating pregnancy: Secondary | ICD-10-CM | POA: Insufficient documentation

## 2014-01-04 DIAGNOSIS — O219 Vomiting of pregnancy, unspecified: Secondary | ICD-10-CM

## 2014-01-04 DIAGNOSIS — O99320 Drug use complicating pregnancy, unspecified trimester: Secondary | ICD-10-CM

## 2014-01-04 HISTORY — DX: Encounter for screening for other viral diseases: Z11.59

## 2014-01-04 HISTORY — DX: Herpesviral infection, unspecified: B00.9

## 2014-01-04 LAB — URINALYSIS, ROUTINE W REFLEX MICROSCOPIC
BILIRUBIN URINE: NEGATIVE
GLUCOSE, UA: NEGATIVE mg/dL
KETONES UR: NEGATIVE mg/dL
NITRITE: NEGATIVE
PH: 6 (ref 5.0–8.0)
Protein, ur: 100 mg/dL — AB
Specific Gravity, Urine: 1.025 (ref 1.005–1.030)
Urobilinogen, UA: 4 mg/dL — ABNORMAL HIGH (ref 0.0–1.0)

## 2014-01-04 LAB — URINE MICROSCOPIC-ADD ON

## 2014-01-04 NOTE — MAU Note (Signed)
Pt c/o severe flank pain on her right side since yesterday. Admits to using heroin 11 hrs ago. Wants to "detox" Has had no prenatal care.

## 2014-01-04 NOTE — MAU Provider Note (Signed)
Chief Complaint:  Flank Pain  @[email or web address] @  HPI: Shannon Morrison is a 23 y.o. [id number] who presents to maternity admissions reporting lower right-sided flank pain. Pain started after waking up from nap, severe sharp throbbing pain with some radiation down right leg, states pain is progressively getting worse, has remained bed-bound today. Tried hot baths. No medications.  Additionally requests to "detox off of heroin". Currently trying to take suboxone (purchased, not prescribed) for past 2 days, not working. Admits to taking half gram heroin this morning (R-arm, antecubital injection). [facility] ([facility]), [facility] (08/2013).  Of note, admits to taking Xanax today (from old prescription), not currently followed by PCP. Denies any EtOH use or other substances.  Admits to nausea / vomiting (earlier today prior to heroin use). Denies contractions, leakage of fluid or vaginal bleeding. Good fetal movement. Denies fevers/chills, dysuria, hematuria.  Pregnancy Course:  No PNC. Patient reported to be 8 months pregnant, estimated from prior US at [facility] (09/01/13) told she was 12 weeks. Complicated by maternal heroine use. - 1st child: NSVD at term - 2nd child: C-section at term, d/t maternal HSV   Past Medical History: Past Medical History  Diagnosis Date  . Panic attack   . Heroin addiction     x 3 years  . Seizures   . HSV antigen DIF positive     Past obstetric history: OB History  Gravida Para Term Preterm AB SAB TAB Ectopic Multiple Living  4 2 2  1  1   2     # Outcome Date GA Lbr Len/2nd Weight Sex Delivery Anes PTL Lv  4 CUR           3 TAB           2 TRM           1 TRM               Past Surgical History: Past Surgical History  Procedure Laterality Date  . Appendectomy      Family History: Family History  Problem Relation Age of Onset  . Cancer Maternal Grandmother     breast    Social History: History  Substance  Use Topics  . Smoking status: Current Every Day Smoker -- 1.00 packs/day for 11 years    Types: Cigarettes  . Smokeless tobacco: Not on file  . Alcohol Use: No     Comment: OCCASIONAL    Allergies: No Known Allergies  Meds:  Prescriptions prior to admission  Medication Sig Dispense Refill  . ALPRAZolam (XANAX) 1 MG tablet Take 1 mg by mouth daily.      Marland Kitchen. amitriptyline (ELAVIL) 50 MG tablet Take 50 mg by mouth at bedtime.      . norgestimate-ethinyl estradiol (ORTHO-CYCLEN,SPRINTEC,PREVIFEM) 0.25-35 MG-MCG tablet Take 1 tablet by mouth daily.  1 Package  11    ROS: Pertinent findings in history of present illness.  Physical Exam  Blood pressure 128/74, pulse 120, temperature 98.5 F (36.9 C), temperature source Oral, resp. rate 20, last menstrual period 06/19/2013, SpO2 98.00%. GENERAL: Tired-appearing, but non-toxic. Cooperative, uncomfortable due to pain  HEENT: NCAT HEART: RRR RESP: CTAB ABDOMEN: Soft, non-tender, gravid appropriate for gestational age MSK: Back - non-tender to palpation. Right flank non-tender. No significant CVAT EXTREMITIES: Non-tender, no edema NEURO: alert and oriented    FHT:  Baseline 145, moderate variability, accelerations present (10x10), no decelerations Contractions: None   Labs: Results for orders placed during  the hospital encounter of 01/04/14 (from the past 24 hour(s))  URINALYSIS, ROUTINE W REFLEX MICROSCOPIC     Status: Abnormal   Collection Time    01/04/14 10:50 PM      Result Value Ref Range   Color, Urine ORANGE (*) YELLOW   APPearance CLOUDY (*) CLEAR   Specific Gravity, Urine 1.025  1.005 - 1.030   pH 6.0  5.0 - 8.0   Glucose, UA NEGATIVE  NEGATIVE mg/dL   Hgb urine dipstick MODERATE (*) NEGATIVE   Bilirubin Urine NEGATIVE  NEGATIVE   Ketones, ur NEGATIVE  NEGATIVE mg/dL   Protein, ur 923 (*) NEGATIVE mg/dL   Urobilinogen, UA 4.0 (*) 0.0 - 1.0 mg/dL   Nitrite NEGATIVE  NEGATIVE   Leukocytes, UA TRACE (*) NEGATIVE  URINE  RAPID DRUG SCREEN (HOSP PERFORMED)     Status: Abnormal   Collection Time    01/04/14 10:50 PM      Result Value Ref Range   Opiates POSITIVE (*) NONE DETECTED   Cocaine NONE DETECTED  NONE DETECTED   Benzodiazepines POSITIVE (*) NONE DETECTED   Amphetamines NONE DETECTED  NONE DETECTED   Tetrahydrocannabinol NONE DETECTED  NONE DETECTED   Barbiturates NONE DETECTED  NONE DETECTED  URINE MICROSCOPIC-ADD ON     Status: Abnormal   Collection Time    01/04/14 10:50 PM      Result Value Ref Range   Squamous Epithelial / LPF FEW (*) RARE   WBC, UA 3-6  <3 WBC/hpf   RBC / HPF 11-20  <3 RBC/hpf   Bacteria, UA MANY (*) RARE    Imaging:  No results found. MAU Course:   Assessment: 1. Right flank pain     Shannon Morrison is a 23 y.o. [id number] at [redacted]w[redacted]d by [place], presented with R-sided severe R-flank pain and concern about withdrawal from heroin with request for detox. VSS, initial assessment without CVAT or reproducible flank pain, considered nephrolithiasis, however history not consistent. Initially sent to [place] for pregnancy dating measurements. Note UDS consistent with admitted opiate / benzo use.  UPDATE: 52 - Limited US consistent with 28w4 measurements. UA review with mod hgb, trace leuks, neg nitrite, RBC 11-20 / WBC 3-6, many bact and few squam. Consider UTI given UA, afebrile and no n/v less likely pyelo, also increased suspicion for nephrolithiasis, ordered Renal US. Hesitant to prescribe any narcotics for pain control given substance abuse.  UPDATE 0240 - Renal US with mild dilatation of R-renal pelvis without caliectasis, bilaterally enlarged kidneys. No visualization of stone, however cannot rule out. Patient resting, some improved but persistent pain.  UPDATE 0430 - Persistent pain, with nursing observed significant emesis, shortly after Phenergan PO given. Discussed with pharmacy pain control options and concerns of withdrawal, given patient reported using heroin and taking Suboxone  today, suspect withdrawal is unlikely due to long half life. Decision for trial of Morphine 4mg  IV (however, high likelihood of decreased responsiveness d/t suboxone), pharmacy advised Tylenol 1000mg  + Hydroxyzine 50mg . Started peripheral IV, 1L LR bolus + 25mg  Phenergan IV.  UPDATE 0630 - Resting, some improved pain and reduced nausea. Called husband to pick her up after 8:00am. Plan to consult weekend CSW on call (after 8:30am) to provide community resources, patient ideally needs to establish with methadone clinic, as likely not safe to detox during pregnancy.   UPDATE 0830 - Consulted CSW [phone]) to evaluate patient and determine appropriate resources and plan to arrange for methadone or suboxone clinic.  UPDATE 12:15 PM: CSW  working to find potential methadone clinic for this patient. 1 additional hour. Will provide resources for methadone clinic and allow to establish care in clinic.  Plan: - UA, UDS - Limited OB US for dating (consistent with 28 weeks) ([facility] - Renal US - non-diagnostic for nephrolithiasis, can't rule out will treat empirically with Tamsulosin x 1 week, advised to continue current medications (for heroin withdrawal) for pain, may take Tylenol - Empiric antibiotics for UTI, Keflex 500mg  TID x 5 days - Rx of Phenergan - Unable to provide heroin detox in MAU. Advised patient to seek alternative treatment facility - Consult CSW (no PNC, need substance abuse program, methadone clinic) - Instructed patient on importance of regular PNC, strongly urged to follow-up with [facility] or [facility]. - Discharge home    Medication List    ASK your doctor about these medications       ALPRAZolam 1 MG tablet  Commonly known as:  XANAX  Take 1 mg by mouth daily.     amitriptyline 50 MG tablet  Commonly known as:  ELAVIL  Take 50 mg by mouth at bedtime.     norgestimate-ethinyl estradiol 0.25-35 MG-MCG tablet  Commonly known  as:  ORTHO-CYCLEN,SPRINTEC,PREVIFEM  Take 1 tablet by mouth daily.        Saralyn PilarAlexander Karamalegos, DO [facility], PGY-1 01/05/2014 3:45 AM  I spoke with and examined patient and agree with resident's note and plan of care.  Tawana ScaleMichael Ryan Bobbette Eakes, MD OB Fellow 01/05/2014 1:10 PM

## 2014-01-05 ENCOUNTER — Inpatient Hospital Stay (HOSPITAL_COMMUNITY): Payer: Self-pay

## 2014-01-05 DIAGNOSIS — R109 Unspecified abdominal pain: Secondary | ICD-10-CM

## 2014-01-05 LAB — RAPID URINE DRUG SCREEN, HOSP PERFORMED
AMPHETAMINES: NOT DETECTED
BARBITURATES: NOT DETECTED
BENZODIAZEPINES: POSITIVE — AB
Cocaine: NOT DETECTED
Opiates: POSITIVE — AB
Tetrahydrocannabinol: NOT DETECTED

## 2014-01-05 MED ORDER — LACTATED RINGERS IV SOLN: INTRAVENOUS | Status: DC

## 2014-01-05 MED ORDER — HYDROXYZINE HCL 50 MG PO TABS
50.0000 mg | ORAL_TABLET | Freq: Once | ORAL | Status: AC
  Administered 2014-01-05: 50 mg via ORAL
  Filled 2014-01-05: qty 1

## 2014-01-05 MED ORDER — MORPHINE SULFATE 4 MG/ML IJ SOLN
4.0000 mg | Freq: Once | INTRAMUSCULAR | Status: DC
Start: 2014-01-05 — End: 2014-01-05

## 2014-01-05 MED ORDER — PROMETHAZINE HCL 25 MG/ML IJ SOLN
25.0000 mg | Freq: Once | INTRAMUSCULAR | Status: AC
  Administered 2014-01-05: 25 mg via INTRAMUSCULAR
  Filled 2014-01-05: qty 1

## 2014-01-05 MED ORDER — PROMETHAZINE HCL 25 MG/ML IJ SOLN: 25.0000 mg | Freq: Once | INTRAMUSCULAR | Status: DC

## 2014-01-05 MED ORDER — MORPHINE SULFATE 4 MG/ML IJ SOLN
4.0000 mg | Freq: Once | INTRAMUSCULAR | Status: AC
  Administered 2014-01-05: 4 mg via INTRAVENOUS
  Filled 2014-01-05: qty 1

## 2014-01-05 MED ORDER — PROMETHAZINE HCL 25 MG PO TABS: 25.0000 mg | ORAL_TABLET | Freq: Four times a day (QID) | ORAL | Status: AC | PRN

## 2014-01-05 MED ORDER — PROMETHAZINE HCL 25 MG PO TABS
25.0000 mg | ORAL_TABLET | Freq: Once | ORAL | Status: AC
  Administered 2014-01-05: 25 mg via ORAL
  Filled 2014-01-05: qty 1

## 2014-01-05 MED ORDER — ONDANSETRON 8 MG PO TBDP
8.0000 mg | ORAL_TABLET | Freq: Once | ORAL | Status: AC
  Administered 2014-01-05: 8 mg via ORAL
  Filled 2014-01-05: qty 1

## 2014-01-05 MED ORDER — TAMSULOSIN HCL 0.4 MG PO CAPS: 0.4000 mg | ORAL_CAPSULE | Freq: Every day | ORAL | Status: DC

## 2014-01-05 MED ORDER — SODIUM CHLORIDE 0.9 % IV SOLN
25.0000 mg | Freq: Once | INTRAVENOUS | Status: AC
  Administered 2014-01-05: 25 mg via INTRAVENOUS
  Filled 2014-01-05: qty 1

## 2014-01-05 MED ORDER — ACETAMINOPHEN 500 MG PO TABS
1000.0000 mg | ORAL_TABLET | Freq: Once | ORAL | Status: AC
  Administered 2014-01-05: 1000 mg via ORAL
  Filled 2014-01-05: qty 2

## 2014-01-05 MED ORDER — CEPHALEXIN 500 MG PO CAPS: 500.0000 mg | ORAL_CAPSULE | Freq: Four times a day (QID) | ORAL | Status: DC

## 2014-01-05 MED ORDER — PROMETHAZINE HCL 12.5 MG RE SUPP: 12.5000 mg | Freq: Four times a day (QID) | RECTAL | Status: AC | PRN

## 2014-01-05 NOTE — MAU Provider Note (Signed)
Attestation of Attending Supervision of Obstetric Fellow: Evaluation and management procedures were performed by the Obstetric Fellow under my supervision and collaboration.  I have reviewed the Obstetric Fellow's note and chart, and I agree with the management and plan.  Kanitra Purifoy, MD, FACOG Attending Obstetrician & Gynecologist Faculty Practice, [facility]   

## 2014-01-05 NOTE — Discharge Instructions (Signed)
Our Ultrasound results showed that you are [redacted] weeks along in your pregnancy (approaching your 3rd Trimester). For your pain, our testing showed that you may have a Kidney Stone, however we were unable to see it on the Ultrasound of your kidneys. It is very important to stay really well hydrated, drink plenty of water. We have prescribed you a medicine Flomax to help your kidneys pass a potential stone, take it for 1 week. Also, your urine may be consistent with a Urinary Tract Infection (we have sent it for culture), and have prescribed an antibiotic for you to take for 5 days for coverage. You may take Phenergan for your nausea, as this is normal in pregnancy and also may be one of your withdrawal symptoms. We strongly advise seeking help for detox to come off of heroin, as it can be harmful to your baby. Please follow through with your plans to seek [facility], it is very important now that you are late in your pregnancy.  Third Trimester of Pregnancy The third trimester is from week 29 through week 42, months 7 through 9. The third trimester is a time when the fetus is growing rapidly. At the end of the ninth month, the fetus is about 20 inches in length and weighs 6 10 pounds.  BODY CHANGES Your body goes through many changes during pregnancy. The changes vary from woman to woman.   Your weight will continue to increase. You can expect to gain 25 35 pounds (11 16 kg) by the end of the pregnancy.  You may begin to get stretch marks on your hips, abdomen, and breasts.  You may urinate more often because the fetus is moving lower into your pelvis and pressing on your bladder.  You may develop or continue to have heartburn as a result of your pregnancy.  You may develop constipation because certain hormones are causing the muscles that push waste through your intestines to slow down.  You may develop hemorrhoids or swollen, bulging veins (varicose veins).  You  may have pelvic pain because of the weight gain and pregnancy hormones relaxing your joints between the bones in your pelvis. Back aches may result from over exertion of the muscles supporting your posture.  Your breasts will continue to grow and be tender. A yellow discharge may leak from your breasts called colostrum.  Your belly button may stick out.  You may feel short of breath because of your expanding uterus.  You may notice the fetus "dropping," or moving lower in your abdomen.  You may have a bloody mucus discharge. This usually occurs a few days to a week before labor begins.  Your cervix becomes thin and soft (effaced) near your due date. WHAT TO EXPECT AT YOUR PRENATAL EXAMS  You will have prenatal exams every 2 weeks until week 36. Then, you will have weekly prenatal exams. During a routine prenatal visit:  You will be weighed to make sure you and the fetus are growing normally.  Your blood pressure is taken.  Your abdomen will be measured to track your baby's growth.  The fetal heartbeat will be listened to.  Any test results from the previous visit will be discussed.  You may have a cervical check near your due date to see if you have effaced. At around 36 weeks, your caregiver will check your cervix. At the same time, your caregiver will also perform a test on the secretions of the vaginal tissue.  This test is to determine if a type of bacteria, Group B streptococcus, is present. Your caregiver will explain this further. Your caregiver may ask you:  What your birth plan is.  How you are feeling.  If you are feeling the baby move.  If you have had any abnormal symptoms, such as leaking fluid, bleeding, severe headaches, or abdominal cramping.  If you have any questions. Other tests or screenings that may be performed during your third trimester include:  Blood tests that check for low iron levels (anemia).  Fetal testing to check the health, activity level,  and growth of the fetus. Testing is done if you have certain medical conditions or if there are problems during the pregnancy. FALSE LABOR You may feel small, irregular contractions that eventually go away. These are called Braxton Hicks contractions, or false labor. Contractions may last for hours, days, or even weeks before true labor sets in. If contractions come at regular intervals, intensify, or become painful, it is best to be seen by your caregiver.  SIGNS OF LABOR   Menstrual-like cramps.  Contractions that are 5 minutes apart or less.  Contractions that start on the top of the uterus and spread down to the lower abdomen and back.  A sense of increased pelvic pressure or back pain.  A watery or bloody mucus discharge that comes from the vagina. If you have any of these signs before the 37th week of pregnancy, call your caregiver right away. You need to go to the hospital to get checked immediately. HOME CARE INSTRUCTIONS   Avoid all smoking, herbs, alcohol, and unprescribed drugs. These chemicals affect the formation and growth of the baby.  Follow your caregiver's instructions regarding medicine use. There are medicines that are either safe or unsafe to take during pregnancy.  Exercise only as directed by your caregiver. Experiencing uterine cramps is a good sign to stop exercising.  Continue to eat regular, healthy meals.  Wear a good support bra for breast tenderness.  Do not use hot tubs, steam rooms, or saunas.  Wear your seat belt at all times when driving.  Avoid raw meat, uncooked cheese, cat litter boxes, and soil used by cats. These carry germs that can cause birth defects in the baby.  Take your prenatal vitamins.  Try taking a stool softener (if your caregiver approves) if you develop constipation. Eat more high-fiber foods, such as fresh vegetables or fruit and whole grains. Drink plenty of fluids to keep your urine clear or pale yellow.  Take warm sitz  baths to soothe any pain or discomfort caused by hemorrhoids. Use hemorrhoid cream if your caregiver approves.  If you develop varicose veins, wear support hose. Elevate your feet for 15 minutes, 3 4 times a day. Limit salt in your diet.  Avoid heavy lifting, wear low heal shoes, and practice good posture.  Rest a lot with your legs elevated if you have leg cramps or low back pain.  Visit your dentist if you have not gone during your pregnancy. Use a soft toothbrush to brush your teeth and be gentle when you floss.  A sexual relationship may be continued unless your caregiver directs you otherwise.  Do not travel far distances unless it is absolutely necessary and only with the approval of your caregiver.  Take prenatal classes to understand, practice, and ask questions about the labor and delivery.  Make a trial run to the hospital.  Pack your hospital bag.  Prepare the baby's nursery.  Continue to go to all your prenatal visits as directed by your caregiver. SEEK MEDICAL CARE IF:  You are unsure if you are in labor or if your water has broken.  You have dizziness.  You have mild pelvic cramps, pelvic pressure, or nagging pain in your abdominal area.  You have persistent nausea, vomiting, or diarrhea.  You have a bad smelling vaginal discharge.  You have pain with urination. SEEK IMMEDIATE MEDICAL CARE IF:   You have a fever.  You are leaking fluid from your vagina.  You have spotting or bleeding from your vagina.  You have severe abdominal cramping or pain.  You have rapid weight loss or gain.  You have shortness of breath with chest pain.  You notice sudden or extreme swelling of your face, hands, ankles, feet, or legs.  You have not felt your baby move in over an hour.  You have severe headaches that do not go away with medicine.  You have vision changes. Document Released: 08/24/2001 Document Revised: 05/02/2013 Document Reviewed: 10/31/2012 [facility]  Patient Information 2014 [place], [place].

## 2014-01-05 NOTE — Progress Notes (Signed)
Acknowledged order for Social Work consult regarding a [redacted] week pregnant mother with hx of heroin abuse.  Met with mother who seemed uncomfortable during entire visit.  She complained of pain and had trouble staying awake.  She reports no hx of PNC due to her abuse of heroin.  She admits to using 1.5gm of heroin a day and 46m of Xanax every other day.  Patient has two other dependents ages 553and 370  She admits to use of drugs during pregnancy with these two children.  She abused pain pills with the 24year old and reported heroin use with the 24year old.  Informed that the f85year old is in the care of maternal great grandmother and the three year old is being cared for by her cousin.  She reports no support from her family "they disowned me".  She and FOB cohabitate.  They are reportedly staying in a motel.  Informed that he is also addicted to heroin.  Mother communicate desire to detox.  Explained to her that she will need a maintenance program such as suboxone or methadone during the pregnancy.  She seemed to understand this.  Discuss efforts to place patient in a residential treatment facility.  Explained some of the barriers such as no admissions over the weekend.  Reached out to several providers and was unable to secure placement over the weekend.  Discussed out patient providers for suboxone or methadone.  Mother seemed familiar with the programs.  Provided her with list of residential treatment providers and out patient providers for substance abuse.  Patient was aware of the cost for daily methadone and stated that FOB would pay for it.  Informed that although he does not work, he has income.   [facility].  [facility]  Case was discussed with the nurse caring for patient and the Attending.

## 2014-01-08 LAB — URINE CULTURE: Colony Count: >=100000

## 2014-01-09 ENCOUNTER — Encounter: Payer: Self-pay | Admitting: Family Medicine

## 2014-01-09 ENCOUNTER — Telehealth: Payer: Self-pay | Admitting: Obstetrics & Gynecology

## 2014-01-09 NOTE — Telephone Encounter (Signed)
Called first number patient had, but it was the wrong number. Called second number and no answer. Will send certified letter for patient to call for an appointment.

## 2014-01-21 ENCOUNTER — Encounter: Payer: Self-pay | Admitting: Family Medicine

## 2014-02-06 ENCOUNTER — Encounter: Payer: Self-pay | Admitting: General Practice

## 2014-02-08 ENCOUNTER — Encounter: Payer: Self-pay | Admitting: General Practice

## 2014-02-24 ENCOUNTER — Encounter (HOSPITAL_COMMUNITY): Payer: Self-pay | Admitting: *Deleted

## 2014-02-24 ENCOUNTER — Inpatient Hospital Stay (HOSPITAL_COMMUNITY)
Admission: AD | Admit: 2014-02-24 | Discharge: 2014-02-24 | Disposition: A | Discharge status: Home / Self Care | Payer: Medicaid Other | Source: Ambulatory Visit | Attending: Family Medicine | Admitting: Family Medicine

## 2014-02-24 DIAGNOSIS — O2390 Unspecified genitourinary tract infection in pregnancy, unspecified trimester: Secondary | ICD-10-CM | POA: Insufficient documentation

## 2014-02-24 DIAGNOSIS — O99891 Other specified diseases and conditions complicating pregnancy: Secondary | ICD-10-CM | POA: Insufficient documentation

## 2014-02-24 DIAGNOSIS — F1110 Opioid abuse, uncomplicated: Secondary | ICD-10-CM

## 2014-02-24 DIAGNOSIS — O26899 Other specified pregnancy related conditions, unspecified trimester: Secondary | ICD-10-CM

## 2014-02-24 DIAGNOSIS — N390 Urinary tract infection, site not specified: Secondary | ICD-10-CM

## 2014-02-24 DIAGNOSIS — O99323 Drug use complicating pregnancy, third trimester: Secondary | ICD-10-CM

## 2014-02-24 DIAGNOSIS — F419 Anxiety disorder, unspecified: Secondary | ICD-10-CM

## 2014-02-24 DIAGNOSIS — R109 Unspecified abdominal pain: Secondary | ICD-10-CM | POA: Insufficient documentation

## 2014-02-24 DIAGNOSIS — O0930 Supervision of pregnancy with insufficient antenatal care, unspecified trimester: Secondary | ICD-10-CM | POA: Insufficient documentation

## 2014-02-24 DIAGNOSIS — O0933 Supervision of pregnancy with insufficient antenatal care, third trimester: Secondary | ICD-10-CM

## 2014-02-24 DIAGNOSIS — M549 Dorsalgia, unspecified: Secondary | ICD-10-CM | POA: Insufficient documentation

## 2014-02-24 DIAGNOSIS — O99330 Smoking (tobacco) complicating pregnancy, unspecified trimester: Secondary | ICD-10-CM | POA: Insufficient documentation

## 2014-02-24 DIAGNOSIS — O9989 Other specified diseases and conditions complicating pregnancy, childbirth and the puerperium: Principal | ICD-10-CM

## 2014-02-24 LAB — RAPID URINE DRUG SCREEN, HOSP PERFORMED
Amphetamines: NOT DETECTED
Barbiturates: NOT DETECTED
Benzodiazepines: POSITIVE — AB
Cocaine: NOT DETECTED
OPIATES: NOT DETECTED
TETRAHYDROCANNABINOL: POSITIVE — AB

## 2014-02-24 LAB — ABO/RH: ABO/RH(D): A POS

## 2014-02-24 LAB — URINALYSIS, ROUTINE W REFLEX MICROSCOPIC
Glucose, UA: 100 mg/dL — AB
KETONES UR: NEGATIVE mg/dL
Nitrite: POSITIVE — AB
PROTEIN: 30 mg/dL — AB
Specific Gravity, Urine: >1.03 — ABNORMAL HIGH (ref 1.005–1.030)
Urobilinogen, UA: >8 mg/dL — ABNORMAL HIGH (ref 0.0–1.0)
pH: 6 (ref 5.0–8.0)

## 2014-02-24 LAB — CBC
HEMATOCRIT: 33.4 % — AB (ref 36.0–46.0)
HEMOGLOBIN: 11.4 g/dL — AB (ref 12.0–15.0)
MCH: 30.8 pg (ref 26.0–34.0)
MCHC: 34.1 g/dL (ref 30.0–36.0)
MCV: 90.3 fL (ref 78.0–100.0)
PLATELETS: 313 10*3/uL (ref 150–400)
RBC: 3.7 MIL/uL — AB (ref 3.87–5.11)
RDW: 14.4 % (ref 11.5–15.5)
WBC: 21.4 10*3/uL — ABNORMAL HIGH (ref 4.0–10.5)

## 2014-02-24 LAB — TYPE AND SCREEN
ABO/RH(D): A POS
Antibody Screen: NEGATIVE

## 2014-02-24 LAB — URINE MICROSCOPIC-ADD ON

## 2014-02-24 LAB — WET PREP, GENITAL
CLUE CELLS WET PREP: NONE SEEN
TRICH WET PREP: NONE SEEN
Yeast Wet Prep HPF POC: NONE SEEN

## 2014-02-24 LAB — DIFFERENTIAL
Basophils Absolute: 0.1 10*3/uL (ref 0.0–0.1)
Basophils Relative: 0 % (ref 0–1)
EOS ABS: 0 10*3/uL (ref 0.0–0.7)
EOS PCT: 0 % (ref 0–5)
LYMPHS ABS: 2.6 10*3/uL (ref 0.7–4.0)
LYMPHS PCT: 12 % (ref 12–46)
MONO ABS: 1.8 10*3/uL — AB (ref 0.1–1.0)
MONOS PCT: 9 % (ref 3–12)
Neutro Abs: 16.9 10*3/uL — ABNORMAL HIGH (ref 1.7–7.7)
Neutrophils Relative %: 79 % — ABNORMAL HIGH (ref 43–77)

## 2014-02-24 LAB — HIV ANTIBODY (ROUTINE TESTING W REFLEX): HIV: NONREACTIVE

## 2014-02-24 LAB — RPR

## 2014-02-24 LAB — HEPATITIS B SURFACE ANTIGEN: HEP B S AG: NEGATIVE

## 2014-02-24 MED ORDER — AMITRIPTYLINE HCL 25 MG PO TABS: 25.0000 mg | ORAL_TABLET | Freq: Every day | ORAL | Status: DC

## 2014-02-24 MED ORDER — CEPHALEXIN 500 MG PO CAPS: 500.0000 mg | ORAL_CAPSULE | Freq: Four times a day (QID) | ORAL | Status: DC

## 2014-02-24 MED ORDER — HYDROXYZINE HCL 25 MG PO TABS: 25.0000 mg | ORAL_TABLET | Freq: Three times a day (TID) | ORAL | Status: DC | PRN

## 2014-02-24 NOTE — MAU Provider Note (Signed)
First Provider Initiated Contact with Patient 02/24/14 1641      Chief Complaint:  Back Pain and Abdominal Cramping   Shannon Morrison Orlowski is  23 y.o. [id number] at 5269w5d presents complaining of Back Pain and Abdominal Cramping .  She states none contractions are associated with none vaginal bleeding, intact membranes, along with active fetal movement.  Complains of some cramping pains in her lower abdomen and lower back, has taken no pain meds for relief. Whitish discharge over the past week or so, no foul odor. No new sexual partners since becoming pregnant.   No prenatal care, pt had concerns over having no insurance. 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 Patient states "I detoxed myself" and is not on Suboxone. She has been getting Rx for Xanax and Amitryptilene [facility]" and would like refills on these and her PNV. No other medications used. No drug abuse per patient. She is separated from the FOB, they were living together earlier in the pregnancy and he was also heroin addict. Mother is present with her today, better support system, still anxious, no suicidal/depressive problems.   Obstetrical/Gynecological History: OB History   Grav Para Term Preterm Abortions TAB SAB Ect Mult Living   4 2 2  1 1    2      NSVD x 1 Second pregnancy delivered PLTCS for breech presentation   Past Medical History: Past Medical History  Diagnosis Date  . Panic attack   . Heroin addiction     x 3 years  . Seizures   . HSV antigen DIF positive     Past Surgical History: Past Surgical History  Procedure Laterality Date  . Appendectomy    . Cesarean section      Family History: Family History  Problem Relation Age of Onset  . Cancer Maternal Grandmother     breast    Social History: History  Substance Use Topics  . Smoking status: Current Every Day Smoker -- 1.00 packs/day for 11 years    Types: Cigarettes  . Smokeless tobacco: Not on file  . Alcohol  Use: No     Comment: OCCASIONAL    Allergies: No Known Allergies  Meds:  Prescriptions prior to admission  Medication Sig Dispense Refill  . ALPRAZolam (XANAX) 1 MG tablet Take 1 mg by mouth daily as needed for anxiety.       . promethazine (PHENERGAN) 12.5 MG suppository Place 1 suppository (12.5 mg total) rectally every 6 (six) hours as needed for nausea or vomiting.  12 each  0  . promethazine (PHENERGAN) 25 MG tablet Take 1 tablet (25 mg total) by mouth every 6 (six) hours as needed for nausea or vomiting.  30 tablet  0    Review of Systems -   Review of Systems  Constitutional: Negative for fever, chills HENT: Negative for sore throat, neck pain.   Eyes: Negative for blurred vision, double vision,  Respiratory: Negative for cough, shortness of breath,   Cardiovascular: Negative for chest pain, palpitations, orthopnea,  leg swelling  Gastrointestinal: Negative for heartburn, nausea, vomiting, diarrhea, constipation, blood in stool. Lower abdominal cramping as per HPI Genitourinary: Negative for dysuria, urgency, frequency, hematuria and flank pain.  Musculoskeletal: Negative for myalgias,, joint pain and falls. w back pain as per HPI, no shooting pains/numbness in legs.  Neurological: Negative for dizziness, tingling, tremors, sensory change, speech change, focal weakness, seizures, Psychiatric/Behavioral: Negative for depression, suicidal ideas, hallucinations, memory loss. Problems with anxiety - on Xanax  and Amitryptilene but out of these Rx. No SI.       Physical Exam  Blood pressure 140/71, pulse 130, temperature 98.3 F (36.8 C), resp. rate 16, last menstrual period 06/19/2013. GENERAL: Well-developed, well-nourished female in no acute distress.  LUNGS: Clear to auscultation bilaterally.  HEART: Regular rate and rhythm. ABDOMEN: Soft, nontender, nondistended, gravid.  EXTREMITIES: Nontender, no edema, 2+ distal pulses. CERVICAL EXAM: Dilatation 0cm   Effacement 0%    Station -3 Presentation: cephalic FHT:  Baseline rate 130bpm   Variability moderate  Accelerations present   Decelerations none Contractions: none   Labs: Results for orders placed during the hospital encounter of 02/24/14 (from the past 24 hour(s))  URINALYSIS, ROUTINE W REFLEX MICROSCOPIC   Collection Time    02/24/14  4:00 PM      Result Value Ref Range   Color, Urine AMBER (*) YELLOW   APPearance HAZY (*) CLEAR   Specific Gravity, Urine >1.030 (*) 1.005 - 1.030   pH 6.0  5.0 - 8.0   Glucose, UA 100 (*) NEGATIVE mg/dL   Hgb urine dipstick LARGE (*) NEGATIVE   Bilirubin Urine SMALL (*) NEGATIVE   Ketones, ur NEGATIVE  NEGATIVE mg/dL   Protein, ur 30 (*) NEGATIVE mg/dL   Urobilinogen, UA >1.6>8.0 (*) 0.0 - 1.0 mg/dL   Nitrite POSITIVE (*) NEGATIVE   Leukocytes, UA TRACE (*) NEGATIVE  URINE MICROSCOPIC-ADD ON   Collection Time    02/24/14  4:00 PM      Result Value Ref Range   Squamous Epithelial / LPF FEW (*) RARE   WBC, UA 21-50  <3 WBC/hpf   RBC / HPF 11-20  <3 RBC/hpf   Bacteria, UA MANY (*) RARE   Urine-Other MUCOUS PRESENT     Imaging Studies:  No results found.  Assessment: Shannon Morrison Correia is  23 y.o. [phone] at 3035w5d by LMP c/w US @ 28wk, presents with abdominal cramping and back pain, likely normal pregnancy pain. Also vaginal discharge, UTI, no PNC  Plan:  Abdominal and Low Back Pain - normal pregnancy discomfort, advised hydration, TYlnol as directed, lie on L side. Labor precautions reviewed. Return to MAU for fever >100.4, decreased FM, LOF/VB.   UTI - cultures pending, will treat empirically based on most recent culture, sensitive to cephalosporin, Rx Keflex 500mg  qid x 7 d  Vaginal Discharge - GC/CT collected, wet mount neg except WBC  No PNC - Prenatal labs and GBS collected today. Given information on clinic, ordered anatomy US, counseled on importance of routine care  Anxiety - will refill amitriptyline, d/c xanax, will start hydroxyzine as alternative.  Recommend followup with [facility] and if psychiatric issues severe may consider psych referral for long term management.   Hx heroin addiction - pt has d/c use, not on suboxone/methadone alternative, high risk relapse, UDS collected today.      Sunnie Nielsenlexander, Natalie 6/14/20155:08 PM  I was present for the exam and agree with above. Significant leukocytosis. No CVAT, phlebitis, cough, chills, or other signs of infection except UTI and URI w/ sinus pressure x several weeks. Pyelonephritis precautions.  [place]ia Braydan Marriott, [place] 02/24/2014 7:58 PM

## 2014-02-24 NOTE — MAU Note (Addendum)
Pt reports that she has just returned from the beach where she was "sick for three days". Vomited the last three days, but now feels better. Pt has had cramping since yesterday and has had sharp back pain for a "a while", but today it is more consistant.

## 2014-02-24 NOTE — Discharge Instructions (Signed)
Third Trimester of Pregnancy  The third trimester is from week 29 through week 42, months 7 through 9. The third trimester is a time when the fetus is growing rapidly. At the end of the ninth month, the fetus is about 20 inches in length and weighs 6 10 pounds.   BODY CHANGES  Your body goes through many changes during pregnancy. The changes vary from woman to woman.    Your weight will continue to increase. You can expect to gain 25 35 pounds (11 16 kg) by the end of the pregnancy.   You may begin to get stretch marks on your hips, abdomen, and breasts.   You may urinate more often because the fetus is moving lower into your pelvis and pressing on your bladder.   You may develop or continue to have heartburn as a result of your pregnancy.   You may develop constipation because certain hormones are causing the muscles that push waste through your intestines to slow down.   You may develop hemorrhoids or swollen, bulging veins (varicose veins).   You may have pelvic pain because of the weight gain and pregnancy hormones relaxing your joints between the bones in your pelvis. Back aches may result from over exertion of the muscles supporting your posture.   Your breasts will continue to grow and be tender. A yellow discharge may leak from your breasts called colostrum.   Your belly button may stick out.   You may feel short of breath because of your expanding uterus.   You may notice the fetus "dropping," or moving lower in your abdomen.   You may have a bloody mucus discharge. This usually occurs a few days to a week before labor begins.   Your cervix becomes thin and soft (effaced) near your due date.  WHAT TO EXPECT AT YOUR PRENATAL EXAMS   You will have prenatal exams every 2 weeks until week 36. Then, you will have weekly prenatal exams. During a routine prenatal visit:   You will be weighed to make sure you and the fetus are growing normally.   Your blood pressure is taken.   Your abdomen will be  measured to track your baby's growth.   The fetal heartbeat will be listened to.   Any test results from the previous visit will be discussed.   You may have a cervical check near your due date to see if you have effaced.  At around 36 weeks, your caregiver will check your cervix. At the same time, your caregiver will also perform a test on the secretions of the vaginal tissue. This test is to determine if a type of bacteria, Group B streptococcus, is present. Your caregiver will explain this further.  Your caregiver may ask you:   What your birth plan is.   How you are feeling.   If you are feeling the baby move.   If you have had any abnormal symptoms, such as leaking fluid, bleeding, severe headaches, or abdominal cramping.   If you have any questions.  Other tests or screenings that may be performed during your third trimester include:   Blood tests that check for low iron levels (anemia).   Fetal testing to check the health, activity level, and growth of the fetus. Testing is done if you have certain medical conditions or if there are problems during the pregnancy.  FALSE LABOR  You may feel small, irregular contractions that eventually go away. These are called Braxton Hicks contractions, or   false labor. Contractions may last for hours, days, or even weeks before true labor sets in. If contractions come at regular intervals, intensify, or become painful, it is best to be seen by your caregiver.   SIGNS OF LABOR    Menstrual-like cramps.   Contractions that are 5 minutes apart or less.   Contractions that start on the top of the uterus and spread down to the lower abdomen and back.   A sense of increased pelvic pressure or back pain.   A watery or bloody mucus discharge that comes from the vagina.  If you have any of these signs before the 37th week of pregnancy, call your caregiver right away. You need to go to the hospital to get checked immediately.  HOME CARE INSTRUCTIONS    Avoid all  smoking, herbs, alcohol, and unprescribed drugs. These chemicals affect the formation and growth of the baby.   Follow your caregiver's instructions regarding medicine use. There are medicines that are either safe or unsafe to take during pregnancy.   Exercise only as directed by your caregiver. Experiencing uterine cramps is a good sign to stop exercising.   Continue to eat regular, healthy meals.   Wear a good support bra for breast tenderness.   Do not use hot tubs, steam rooms, or saunas.   Wear your seat belt at all times when driving.   Avoid raw meat, uncooked cheese, cat litter boxes, and soil used by cats. These carry germs that can cause birth defects in the baby.   Take your prenatal vitamins.   Try taking a stool softener (if your caregiver approves) if you develop constipation. Eat more high-fiber foods, such as fresh vegetables or fruit and whole grains. Drink plenty of fluids to keep your urine clear or pale yellow.   Take warm sitz baths to soothe any pain or discomfort caused by hemorrhoids. Use hemorrhoid cream if your caregiver approves.   If you develop varicose veins, wear support hose. Elevate your feet for 15 minutes, 3 4 times a day. Limit salt in your diet.   Avoid heavy lifting, wear low heal shoes, and practice good posture.   Rest a lot with your legs elevated if you have leg cramps or low back pain.   Visit your dentist if you have not gone during your pregnancy. Use a soft toothbrush to brush your teeth and be gentle when you floss.   A sexual relationship may be continued unless your caregiver directs you otherwise.   Do not travel far distances unless it is absolutely necessary and only with the approval of your caregiver.   Take prenatal classes to understand, practice, and ask questions about the labor and delivery.   Make a trial run to the hospital.   Pack your hospital bag.   Prepare the baby's nursery.   Continue to go to all your prenatal visits as directed  by your caregiver.  SEEK MEDICAL CARE IF:   You are unsure if you are in labor or if your water has broken.   You have dizziness.   You have mild pelvic cramps, pelvic pressure, or nagging pain in your abdominal area.   You have persistent nausea, vomiting, or diarrhea.   You have a bad smelling vaginal discharge.   You have pain with urination.  SEEK IMMEDIATE MEDICAL CARE IF:    You have a fever.   You are leaking fluid from your vagina.   You have spotting or bleeding from your vagina.     You have severe abdominal cramping or pain.   You have rapid weight loss or gain.   You have shortness of breath with chest pain.   You notice sudden or extreme swelling of your face, hands, ankles, feet, or legs.   You have not felt your baby move in over an hour.   You have severe headaches that do not go away with medicine.   You have vision changes.  Document Released: 08/24/2001 Document Revised: 05/02/2013 Document Reviewed: 10/31/2012  [employer] Patient Information 2014 [employer]

## 2014-02-24 NOTE — MAU Note (Signed)
Pt presents to MAU with complaints of pain in her lower back and abdomen. Denies any vaginal bleeding or LOF. Pt states [facility]. States has a history of heroin abuse up until her 5th month of pregnancy. States no drug abuse at this time

## 2014-02-25 ENCOUNTER — Telehealth: Payer: Self-pay | Admitting: General Practice

## 2014-02-25 DIAGNOSIS — F419 Anxiety disorder, unspecified: Secondary | ICD-10-CM

## 2014-02-25 DIAGNOSIS — N390 Urinary tract infection, site not specified: Secondary | ICD-10-CM

## 2014-02-25 LAB — GC/CHLAMYDIA PROBE AMP
CT Probe RNA: NEGATIVE
GC Probe RNA: NEGATIVE

## 2014-02-25 MED ORDER — CEPHALEXIN 500 MG PO CAPS: 500.0000 mg | ORAL_CAPSULE | Freq: Four times a day (QID) | ORAL | Status: AC

## 2014-02-25 MED ORDER — HYDROXYZINE HCL 25 MG PO TABS: 25.0000 mg | ORAL_TABLET | Freq: Three times a day (TID) | ORAL | Status: AC | PRN

## 2014-02-25 MED ORDER — AMITRIPTYLINE HCL 25 MG PO TABS: 25.0000 mg | ORAL_TABLET | Freq: Every day | ORAL | Status: DC

## 2014-02-25 NOTE — Telephone Encounter (Signed)
Patient called and left message stating she is supposed to be getting some medications called in to her [facility] pharmacy in [place] on [address] and they haven't received anything yet, she was seen last night in MAU. By chart review looks like orders didn't go through. Reordered medications and sent to [facility]. Called patient and informed her. Patient verbalized understanding and had no further questions

## 2014-02-25 NOTE — MAU Provider Note (Signed)
Attestation of Attending Supervision of Advanced Practitioner (PA/CNM/NP): Evaluation and management procedures were performed by the Advanced Practitioner under my supervision and collaboration.  I have reviewed the Advanced Practitioner's note and chart, and I agree with the management and plan.  Reva BoresPRATT,Jeromiah Ohalloran S, MD [facility] Attending 02/25/2014 5:16 AM

## 2014-02-26 ENCOUNTER — Encounter: Payer: Self-pay | Admitting: Advanced Practice Midwife

## 2014-02-26 DIAGNOSIS — O09899 Supervision of other high risk pregnancies, unspecified trimester: Secondary | ICD-10-CM | POA: Insufficient documentation

## 2014-02-26 DIAGNOSIS — O9989 Other specified diseases and conditions complicating pregnancy, childbirth and the puerperium: Secondary | ICD-10-CM

## 2014-02-26 DIAGNOSIS — Z3493 Encounter for supervision of normal pregnancy, unspecified, third trimester: Secondary | ICD-10-CM | POA: Insufficient documentation

## 2014-02-26 DIAGNOSIS — O2343 Unspecified infection of urinary tract in pregnancy, third trimester: Secondary | ICD-10-CM | POA: Insufficient documentation

## 2014-02-26 DIAGNOSIS — Z283 Underimmunization status: Secondary | ICD-10-CM | POA: Insufficient documentation

## 2014-02-26 LAB — CULTURE, OB URINE
Colony Count: >=100000
Special Requests: NORMAL

## 2014-02-26 LAB — RUBELLA SCREEN: Rubella: 0.42 Index (ref <0.90)

## 2014-02-26 LAB — CULTURE, BETA STREP (GROUP B ONLY): Special Requests: NORMAL

## 2014-02-27 ENCOUNTER — Encounter: Payer: Self-pay | Admitting: Obstetrics and Gynecology

## 2014-02-27 ENCOUNTER — Ambulatory Visit (INDEPENDENT_AMBULATORY_CARE_PROVIDER_SITE_OTHER): Payer: Medicaid Other | Admitting: Obstetrics and Gynecology

## 2014-02-27 VITALS — BP 119/70 | HR 90 | Temp 97.2°F | Wt 186.1 lb

## 2014-02-27 DIAGNOSIS — Z3493 Encounter for supervision of normal pregnancy, unspecified, third trimester: Secondary | ICD-10-CM

## 2014-02-27 DIAGNOSIS — O0930 Supervision of pregnancy with insufficient antenatal care, unspecified trimester: Secondary | ICD-10-CM

## 2014-02-27 DIAGNOSIS — F1110 Opioid abuse, uncomplicated: Secondary | ICD-10-CM

## 2014-02-27 DIAGNOSIS — Z3480 Encounter for supervision of other normal pregnancy, unspecified trimester: Secondary | ICD-10-CM

## 2014-02-27 DIAGNOSIS — O99323 Drug use complicating pregnancy, third trimester: Secondary | ICD-10-CM

## 2014-02-27 DIAGNOSIS — F17200 Nicotine dependence, unspecified, uncomplicated: Secondary | ICD-10-CM

## 2014-02-27 DIAGNOSIS — O99340 Other mental disorders complicating pregnancy, unspecified trimester: Secondary | ICD-10-CM

## 2014-02-27 LAB — POCT URINALYSIS DIP (DEVICE)
Glucose, UA: NEGATIVE mg/dL
KETONES UR: NEGATIVE mg/dL
Nitrite: NEGATIVE
Protein, ur: 30 mg/dL — AB
SPECIFIC GRAVITY, URINE: 1.02 (ref 1.005–1.030)
Urobilinogen, UA: 1 mg/dL (ref 0.0–1.0)
pH: 7 (ref 5.0–8.0)

## 2014-02-27 MED ORDER — PRENATAL PLUS 27-1 MG PO TABS: 1.0000 | ORAL_TABLET | Freq: Every day | ORAL | Status: AC

## 2014-02-27 NOTE — Progress Notes (Signed)
Pt denies use of all drugs since [redacted] wks gestation ( UDS positive on 6/14 for benzodiazepines and tetrahydrocannibinol.  Pt declines Pap smear today.

## 2014-02-27 NOTE — Patient Instructions (Addendum)
Vaginal Bleeding During Pregnancy, Third Trimester A small amount of bleeding (spotting) from the vagina is relatively common in pregnancy. Various things can cause bleeding or spotting in pregnancy. Sometimes the bleeding is normal and is not a problem. However, bleeding during the third trimester can also be a sign of something serious for the mother and the baby. Be sure to tell your health care provider about any vaginal bleeding right away.  Some possible causes of vaginal bleeding during the third trimester include:   The placenta may be partially or completely covering the opening to the cervix (placenta previa).   The placenta may have separated from the uterus (abruption of the placenta).   There may be an infection or growth on the cervix.   You may be starting labor, called discharging of the mucus plug.   The placenta may grow into the muscle layer of the uterus (placenta accreta).  HOME CARE INSTRUCTIONS  Watch your condition for any changes. The following actions may help to lessen any discomfort you are feeling:   Follow your health care provider's instructions for limiting your activity. If your health care provider orders bed rest, you may need to stay in bed and only get up to use the bathroom. However, your health care provider may allow you to continue light activity.  If needed, make plans for someone to help with your regular activities and responsibilities while you are on bed rest.  Keep track of the number of pads you use each day, how often you change pads, and how soaked (saturated) they are. Write this down.  Do not use tampons. Do not douche.  Do not have sexual intercourse or orgasms until approved by your health care provider.  Follow your health care provider's advice about lifting, driving, and physical activities.  If you pass any tissue from your vagina, save the tissue so you can show it to your health care provider.   Only take over-the-counter  or prescription medicines as directed by your health care provider.  Do not take aspirin because it can make you bleed.   Keep all follow-up appointments as directed by your health care provider. SEEK MEDICAL CARE IF:  You have any vaginal bleeding during any part of your pregnancy.  You have cramps or labor pains.  You have a fever, not controlled by medicine. SEEK IMMEDIATE MEDICAL CARE IF:   You have severe cramps or pain in your back or belly (abdomen).  You have chills.  You have a gush of fluid from the vagina.  You pass large clots or tissue from your vagina.  Your bleeding increases.  You feel light-headed or weak.  You pass out.  You feel less movement or no movement of the baby.  MAKE SURE YOU:  Understand these instructions.  Will watch your condition.  Will get help right away if you are not doing well or get worse. Document Released: 11/20/2002 Document Revised: 09/04/2013 Document Reviewed: 05/07/2013 [facility] Patient Information 2015 [place], [place]. This information is not intended to replace advice given to you by your health care provider. Make sure you discuss any questions you have with your health care provider. Smoking Cessation Quitting smoking is important to your health and has many advantages. However, it is not always easy to quit since nicotine is a very addictive drug. Often times, people try 3 times or more before being able to quit. This document explains the best ways for you to prepare to quit smoking. Quitting takes hard  work and a lot of effort, but you can do it. ADVANTAGES OF QUITTING SMOKING  You will live longer, feel better, and live better.  Your body will feel the impact of quitting smoking almost immediately.  Within 20 minutes, blood pressure decreases. Your pulse returns to its normal level.  After 8 hours, carbon monoxide levels in the blood return to normal. Your oxygen level increases.  After 24 hours, the chance of  having a heart attack starts to decrease. Your breath, hair, and body stop smelling like smoke.  After 48 hours, damaged nerve endings begin to recover. Your sense of taste and smell improve.  After 72 hours, the body is virtually free of nicotine. Your bronchial tubes relax and breathing becomes easier.  After 2 to 12 weeks, lungs can hold more air. Exercise becomes easier and circulation improves.  The risk of having a heart attack, stroke, cancer, or lung disease is greatly reduced.  After 1 year, the risk of coronary heart disease is cut in half.  After 5 years, the risk of stroke falls to the same as a nonsmoker.  After 10 years, the risk of lung cancer is cut in half and the risk of other cancers decreases significantly.  After 15 years, the risk of coronary heart disease drops, usually to the level of a nonsmoker.  If you are pregnant, quitting smoking will improve your chances of having a healthy baby.  The people you live with, especially any children, will be healthier.  You will have extra money to spend on things other than cigarettes. QUESTIONS TO THINK ABOUT BEFORE ATTEMPTING TO QUIT You may want to talk about your answers with your caregiver.  Why do you want to quit?  If you tried to quit in the past, what helped and what did not?  What will be the most difficult situations for you after you quit? How will you plan to handle them?  Who can help you through the tough times? Your family? Friends? A caregiver?  What pleasures do you get from smoking? What ways can you still get pleasure if you quit? Here are some questions to ask your caregiver:  How can you help me to be successful at quitting?  What medicine do you think would be best for me and how should I take it?  What should I do if I need more help?  What is smoking withdrawal like? How can I get information on withdrawal? GET READY  Set a quit date.  Change your environment by getting rid of all  cigarettes, ashtrays, matches, and lighters in your home, car, or work. Do not let people smoke in your home.  Review your past attempts to quit. Think about what worked and what did not. GET SUPPORT AND ENCOURAGEMENT You have a better chance of being successful if you have help. You can get support in many ways.  Tell your family, friends, and co-workers that you are going to quit and need their support. Ask them not to smoke around you.  Get individual, group, or telephone counseling and support. Programs are available at [employer] and health centers. Call your local health department for information about programs in your area.  Spiritual beliefs and practices may help some smokers quit.  Download a "quit meter" on your computer to keep track of quit statistics, such as how long you have gone without smoking, cigarettes not smoked, and money saved.  Get a self-help book about quitting smoking and staying off  of tobacco. LEARN NEW SKILLS AND BEHAVIORS  Distract yourself from urges to smoke. Talk to someone, go for a walk, or occupy your time with a task.  Change your normal routine. Take a different route to work. Drink tea instead of coffee. Eat breakfast in a different place.  Reduce your stress. Take a hot bath, exercise, or read a book.  Plan something enjoyable to do every day. Reward yourself for not smoking.  Explore interactive web-based programs that specialize in helping you quit. GET MEDICINE AND USE IT CORRECTLY Medicines can help you stop smoking and decrease the urge to smoke. Combining medicine with the above behavioral methods and support can greatly increase your chances of successfully quitting smoking.  Nicotine replacement therapy helps deliver nicotine to your body without the negative effects and risks of smoking. Nicotine replacement therapy includes nicotine gum, lozenges, inhalers, nasal sprays, and skin patches. Some may be available over-the-counter and  others require a prescription.  Antidepressant medicine helps people abstain from smoking, but how this works is unknown. This medicine is available by prescription.  Nicotinic receptor partial agonist medicine simulates the effect of nicotine in your brain. This medicine is available by prescription. Ask your caregiver for advice about which medicines to use and how to use them based on your health history. Your caregiver will tell you what side effects to look out for if you choose to be on a medicine or therapy. Carefully read the information on the package. Do not use any other product containing nicotine while using a nicotine replacement product.  RELAPSE OR DIFFICULT SITUATIONS Most relapses occur within the first 3 months after quitting. Do not be discouraged if you start smoking again. Remember, most people try several times before finally quitting. You may have symptoms of withdrawal because your body is used to nicotine. You may crave cigarettes, be irritable, feel very hungry, cough often, get headaches, or have difficulty concentrating. The withdrawal symptoms are only temporary. They are strongest when you first quit, but they will go away within 10-14 days. To reduce the chances of relapse, try to:  Avoid drinking alcohol. Drinking lowers your chances of successfully quitting.  Reduce the amount of caffeine you consume. Once you quit smoking, the amount of caffeine in your body increases and can give you symptoms, such as a rapid heartbeat, sweating, and anxiety.  Avoid smokers because they can make you want to smoke.  Do not let weight gain distract you. Many smokers will gain weight when they quit, usually less than 10 pounds. Eat a healthy diet and stay active. You can always lose the weight gained after you quit.  Find ways to improve your mood other than smoking. FOR MORE INFORMATION  www.smokefree.gov  Document Released: 08/24/2001 Document Revised: 02/29/2012 Document  Reviewed: 12/09/2011 [facility] Patient Information 2015 [place], [place]. This information is not intended to replace advice given to you by your health care provider. Make sure you discuss any questions you have with your health care provider. Sterilization Information, Female Female sterilization is a procedure to permanently prevent pregnancy. There are different ways to perform sterilization, but all either block or close the fallopian tubes so that your eggs cannot reach your uterus. If your egg cannot reach your uterus, sperm cannot fertilize the egg, and you cannot get pregnant.  Sterilization is performed by a surgical procedure. Sometimes these procedures are performed in a hospital while a patient is asleep. Sometimes they can be done in a clinic setting with the patient  awake. The fallopian tubes can be surgically cut, tied, or sealed through a procedure called tubal ligation. The fallopian tubes can also be closed with clips or rings. Sterilization can also be done by placing a tiny coil into each fallopian tube, which causes scar tissue to grow inside the tube. The scar tissue then blocks the tubes.  Discuss sterilization with your caregiver to answer any concerns you or your partner may have. You may want to ask what type of sterilization your caregiver performs. Some caregivers may not perform all the various options. Sterilization is permanent and should only be done if you are sure you do not want children or do not want any more children. Having a sterilization reversed may not be successful.  STERILIZATION PROCEDURES  Laparoscopic sterilization. This is a surgical method performed at a time other than right after childbirth. Two incisions are made in the lower abdomen. A thin, lighted tube (laparoscope) is inserted into one of the incisions and is used to perform the procedure. The fallopian tubes are closed with a ring or a clip. An instrument that uses heat could be used to seal the tubes  closed (electrocautery).   Mini-laparotomy. This is a surgical method done 1 or 2 days after giving birth. Typically, a small incision is made just below the belly button (umbilicus) and the fallopian tubes are exposed. The tubes can then be sealed, tied, or cut.   Hysteroscopic sterilization. This is performed at a time other than right after childbirth. A tiny, spring-like coil is inserted through the cervix and uterus and placed into the fallopian tubes. The coil causes scaring and blocks the tubes. Other forms of contraception should be used for 3 months after the procedure to allow the scar tissue to form completely. Additionally, it is required hysterosalpingography be done 3 months later to ensure that the procedure was successful. Hysterosalpingography is a procedure that uses X-rays to look at your uterus and fallopian tubes after a material to make them show up better has been inserted. IS STERILIZATION SAFE? Sterilization is considered safe with very rare complications. Risks depend on the type of procedure you have. As with any surgical procedure, there are risks. Some risks of sterilization by any means include:   Bleeding.  Infection.  Reaction to anesthesia medicine.  Injury to surrounding organs. Risks specific to having hysteroscopic coils placed include:  The coils may not be placed correctly the first time.   The coils may move out of place.   The tubes may not get completely blocked after 3 months.   Injury to surrounding organs when placing the coil.  HOW EFFECTIVE IS FEMALE STERILIZATION? Sterilization is nearly 100% effective, but it can fail. Depending on the type of sterilization, the rate of failure can be as high as 3%. After hysteroscopic sterilization with placement of fallopian tube coils, you will need back-up birth control for 3 months after the procedure. Sterilization is effective for a lifetime.  BENEFITS OF STERILIZATION  It does not affect your  hormones, and therefore will not affect your menstrual periods, sexual desire, or performance.   It is effective for a lifetime.   It is safe.   You do not need to worry about getting pregnant. Keep in mind that if you had the hysteroscopic placement procedure, you must wait 3 months after the procedure (or until your caregiver confirms) before pregnancy is not considered possible.   There are no side effects unlike other types of birth control (contraception).  DRAWBACKS OF STERILIZATION  You must be sure you do not want children or any more children. The procedure is permanent.   It does not provide protection against sexually transmitted infections (STIs).   The tubes can grow back together. If this happens, there is a risk of pregnancy. There is also an increased risk (50%) of pregnancy being an ectopic pregnancy. This is a pregnancy that happens outside of the uterus. Document Released: 02/16/2008 Document Revised: 09/04/2013 Document Reviewed: 12/16/2011 [facility] Patient Information 2015 [place], [place]. This information is not intended to replace advice given to you by your health care provider. Make sure you discuss any questions you have with your health care provider.

## 2014-02-27 NOTE — Progress Notes (Signed)
Subjective:    Shannon Morrison is a [id number] being seen today for her first obstetrical visit (dated by 28 wk US, seen in [facility]).  Her obstetrical history is significant for smoker and NPC, UDS pos opiods 4/15, ASB on Keflex, heavy smoker, renal glucosuria last visit, anxiety . Patient does not intend to breast feed. Pregnancy history fully reviewed. Diff FOB. AGA x2.   Patient reports backache, nausea and occasional contractions.  Filed Vitals:   02/27/14 1350  BP: 119/70  Pulse: 90  Temp: 97.2 F (36.2 C)  Weight: 186 lb 1.6 oz (84.414 kg)    HISTORY: OB History  Gravida Para Term Preterm AB SAB TAB Ectopic Multiple Living  4 2 2  1  1   2     # Outcome Date GA Lbr Len/2nd Weight Sex Delivery Anes PTL Lv  4 CUR           3 TAB           2 TRM           1 TRM              Past Medical History  Diagnosis Date  . Panic attack   . Heroin addiction     x 3 years  . Seizures   . HSV antigen DIF positive    Past Surgical History  Procedure Laterality Date  . Appendectomy    . Cesarean section     Family History  Problem Relation Age of Onset  . Cancer Maternal Grandmother     breast     Exam    Uterus:   34 cm. DT 120s-130  Pelvic Exam: declined                              System: Breast:  normal appearance, no masses or tenderness   Skin: normal coloration and turgor, no rashes    Neurologic: oriented, grossly non-focal, normal mood   Extremities: normal strength, tone, and muscle mass   HEENT PERRLA and thyroid without masses   Mouth/Teeth mucous membranes moist, pharynx normal without lesions   Neck supple and no masses   Cardiovascular: regular rate and rhythm, no murmurs or gallops   Respiratory:  appears well, vitals normal, no respiratory distress, acyanotic, normal RR, ear and throat exam is normal, neck free of mass or lymphadenopathy, chest clear, no wheezing, crepitations, rhonchi, normal symmetric air entry   Abdomen: soft, non-tender;  bowel sounds normal; no masses,  no organomegaly   Urinary: not examined      Assessment:    Pregnancy: [id number] Patient Active Problem List   Diagnosis Date Noted  . UTI (urinary tract infection) in pregnancy in third trimester 02/26/2014  . Supervision of normal pregnancy in third trimester 02/26/2014  . Rubella non-immune status, antepartum 02/26/2014  . No prenatal care in current pregnancy in third trimester 02/24/2014  . Heroin abuse affecting pregnancy in third trimester, antepartum 02/24/2014  . Menometrorrhagia 10/30/2012  . Breast lump on right side at 5 o'clock position 10/13/2012  Smoker Anxiety      Plan:     Initial labs reviewed.  Declined pelvic or Pap due to being busy and tired today (neg 10/13/12)> will offer again next visit Prenatal vitamins prescribed Problem list reviewed and updated. Genetic Screening discussed Integrated Screen: too late.  Ultrasound discussed; fetal survey: scheduled for tomorrow.  [facility]  Will  come get 1 hr OGTT when here for US tomorrow 50% of 30 min visit spent on counseling and coordination of care.  Continues on Elavil, Vistaril and Keflex  Wants PPS but no MC yet> sign consent as soon as MC comes through  [facility],DEIRDRE 02/27/2014

## 2014-02-27 NOTE — Progress Notes (Signed)
Patient did not have 1hr gtt done today. Patient to come tomorrow 02/28/14 at 1245 to do test as she will be here for an ultrasound.

## 2014-02-28 ENCOUNTER — Other Ambulatory Visit: Payer: Medicaid Other

## 2014-02-28 ENCOUNTER — Ambulatory Visit (HOSPITAL_COMMUNITY)
Admission: RE | Admit: 2014-02-28 | Discharge: 2014-02-28 | Disposition: A | Discharge status: Home / Self Care | Payer: Self-pay | Source: Ambulatory Visit | Attending: Advanced Practice Midwife | Admitting: Advanced Practice Midwife

## 2014-02-28 DIAGNOSIS — O0933 Supervision of pregnancy with insufficient antenatal care, third trimester: Secondary | ICD-10-CM

## 2014-02-28 DIAGNOSIS — O0930 Supervision of pregnancy with insufficient antenatal care, unspecified trimester: Secondary | ICD-10-CM

## 2014-02-28 DIAGNOSIS — Z3689 Encounter for other specified antenatal screening: Secondary | ICD-10-CM | POA: Insufficient documentation

## 2014-02-28 DIAGNOSIS — O99340 Other mental disorders complicating pregnancy, unspecified trimester: Secondary | ICD-10-CM | POA: Insufficient documentation

## 2014-02-28 DIAGNOSIS — O99323 Drug use complicating pregnancy, third trimester: Secondary | ICD-10-CM

## 2014-02-28 DIAGNOSIS — F1110 Opioid abuse, uncomplicated: Secondary | ICD-10-CM | POA: Insufficient documentation

## 2014-03-01 LAB — GLUCOSE TOLERANCE, 1 HOUR (50G) W/O FASTING: Glucose, 1 Hour GTT: 125 mg/dL (ref 70–140)

## 2014-03-03 LAB — CANNABANOIDS (GC/LC/MS), URINE: THC-COOH UR CONFIRM: 570 ng/mL — AB (ref <5)

## 2014-03-04 ENCOUNTER — Encounter: Payer: Self-pay | Admitting: Advanced Practice Midwife

## 2014-03-05 LAB — PRESCRIPTION MONITORING PROFILE (19 PANEL)
Amphetamine/Meth: NEGATIVE ng/mL
BUPRENORPHINE, URINE: NEGATIVE ng/mL
Barbiturate Screen, Urine: NEGATIVE ng/mL
Benzodiazepine Screen, Urine: NEGATIVE ng/mL
CARISOPRODOL, URINE: NEGATIVE ng/mL
CREATININE, URINE: 133.73 mg/dL (ref >20.0)
Cocaine Metabolites: NEGATIVE ng/mL
Fentanyl, Ur: NEGATIVE ng/mL
MDMA URINE: NEGATIVE ng/mL
METHADONE SCREEN, URINE: NEGATIVE ng/mL
Meperidine, Ur: NEGATIVE ng/mL
Methaqualone: NEGATIVE ng/mL
NITRITES URINE, INITIAL: NEGATIVE ug/mL
OPIATE SCREEN, URINE: NEGATIVE ng/mL
OXYCODONE SCRN UR: NEGATIVE ng/mL
PROPOXYPHENE: NEGATIVE ng/mL
Phencyclidine, Ur: NEGATIVE ng/mL
Tapentadol, urine: NEGATIVE ng/mL
Tramadol Scrn, Ur: NEGATIVE ng/mL
Zolpidem, Urine: NEGATIVE ng/mL
pH, Initial: 7.2 pH (ref 4.5–8.9)

## 2014-03-28 ENCOUNTER — Encounter: Payer: Medicaid Other | Admitting: Family

## 2014-03-29 ENCOUNTER — Encounter: Payer: Self-pay | Admitting: Advanced Practice Midwife

## 2014-04-01 ENCOUNTER — Encounter: Payer: Self-pay | Admitting: Obstetrics & Gynecology

## 2014-04-10 ENCOUNTER — Encounter: Payer: Self-pay | Admitting: General Practice

## 2014-04-11 ENCOUNTER — Encounter: Payer: Self-pay | Admitting: Obstetrics & Gynecology

## 2014-04-17 ENCOUNTER — Encounter (HOSPITAL_COMMUNITY): Payer: Self-pay

## 2014-04-18 ENCOUNTER — Encounter: Payer: Self-pay | Admitting: Obstetrics & Gynecology

## 2014-04-26 ENCOUNTER — Telehealth: Payer: Self-pay | Admitting: General Practice

## 2014-04-26 ENCOUNTER — Ambulatory Visit: Payer: Self-pay | Admitting: Obstetrics & Gynecology

## 2014-04-26 ENCOUNTER — Encounter: Payer: Self-pay | Admitting: General Practice

## 2014-04-26 NOTE — Telephone Encounter (Signed)
Called patient at home number and message stated this person does not have a mailbox set up yet. Called patient at other number and line was busy. Will send letter

## 2014-05-03 ENCOUNTER — Encounter: Payer: Self-pay | Admitting: General Practice

## 2014-06-17 ENCOUNTER — Ambulatory Visit (INDEPENDENT_AMBULATORY_CARE_PROVIDER_SITE_OTHER): Payer: Self-pay | Admitting: Physician Assistant

## 2014-06-17 VITALS — BP 124/78 | HR 108 | Temp 97.7°F | Resp 16 | Ht 67.0 in | Wt 185.4 lb

## 2014-06-17 DIAGNOSIS — F329 Major depressive disorder, single episode, unspecified: Secondary | ICD-10-CM

## 2014-06-17 DIAGNOSIS — F418 Other specified anxiety disorders: Secondary | ICD-10-CM

## 2014-06-17 DIAGNOSIS — N6314 Unspecified lump in the right breast, lower inner quadrant: Secondary | ICD-10-CM

## 2014-06-17 DIAGNOSIS — F419 Anxiety disorder, unspecified: Principal | ICD-10-CM

## 2014-06-17 DIAGNOSIS — N63 Unspecified lump in breast: Secondary | ICD-10-CM

## 2014-06-17 DIAGNOSIS — F32A Depression, unspecified: Secondary | ICD-10-CM

## 2014-06-17 MED ORDER — AMITRIPTYLINE HCL 50 MG PO TABS: 50.0000 mg | ORAL_TABLET | Freq: Every day | ORAL | Status: DC

## 2014-06-17 MED ORDER — ALPRAZOLAM 0.25 MG PO TABS: 0.2500 mg | ORAL_TABLET | Freq: Two times a day (BID) | ORAL | Status: DC | PRN

## 2014-06-17 NOTE — Patient Instructions (Signed)
Alprazolam: Take at bedtime only. Amitriptyline: Start with 25 mg (1/2) tab at bedtime.  Increase to 50 mg in 7 days.  If needed, you may increase to 2 tabs per night (100 mg) total.

## 2014-06-17 NOTE — Progress Notes (Addendum)
IDENTIFYING INFORMATION  Shannon Morrison / female / 03/17/1991 / 23 y.o. / MRN: [id number]  SUBJECTIVE  Chief Complaint: had a chief complaint of Anxiety and an additional complaint of bump on breast.  History of present illness:  This began 8 months ago with a reemergence of the pt's depression and anxiety.  She reports that she was advised to stop taking her Amytriptaline and Xanex 2/2 to pregnancy.  She complied with stopping the medicine, and her symptoms quickly reemerged. She is also actively seeking work and having difficulty with finding a job.   She reports trying Sertraline in the past for about 2 weeks and reports that it did not work for her.  She also reports that Horatio PelXanex is the only medication that helps with her anxiety symptoms, and reports that she has tried Ativan, Diazepam, and Klonopin without relief of her symptoms.   She reports dysthymia, poor sleep, poor concentration, and appetite change.  She denies anhedonia, suicidality, and a history of post partem depression. She does have a history of heroin abuse affected the third trimester of pregnancy.   She complains of a lesion on her right areola at the 5 o'clock position.  She reports being able to express mild amounts of purulence daily.  She denies pain, redness, induration, fever, chills, and sweats.   She is not currently breast feeding.   Past Medical History  Diagnosis Date  . Panic attack   . Heroin addiction     x 3 years  . Seizures   . HSV antigen DIF positive     History   Social History  . Marital Status: Single    Spouse Name: BN/A    Number of Children: N/A  . Years of Education: N/A   Occupational History  . Not on file.   Social History Main Topics  . Smoking status: Current Every Day Smoker -- 1.00 packs/day for 11 years    Types: Cigarettes  . Smokeless tobacco: Not on file  . Alcohol Use: No     Comment: OCCASIONAL  . Drug Use: Yes    Special: Marijuana, Heroin     Comment:  heroin - denies use after 24 weeks   . Sexual Activity: Not Currently    Birth Control/ Protection: None     Comment: HEROIN      No Known Allergies   Review of Systems  Constitutional: Negative.   HENT: Negative.   Respiratory: Negative.   Cardiovascular: Negative.   Musculoskeletal: Negative.   Skin:       Positive for skin lesion  Neurological: Negative.     OBJECTIVE  Blood pressure 124/78, pulse 108, temperature 97.7 F (36.5 C), temperature source Oral, resp. rate 16, height 5\' 7"  (1.702 m), weight 185 lb 6.4 oz (84.097 kg), last menstrual period 06/17/2014, SpO2 97.00%, denies breast feeding.  Physical Exam  Constitutional: She is oriented to person, place, and time and well-developed, well-nourished, and in no distress.  HENT:  Head: Normocephalic.  Eyes: EOM are normal. Pupils are equal, round, and reactive to light. Right eye exhibits no discharge. Left eye exhibits no discharge.  Neck: Normal range of motion. No thyromegaly present.  Cardiovascular: Regular rhythm.   Pulmonary/Chest: Effort normal.  Neurological: She is alert and oriented to person, place, and time.  Skin: Skin is warm and dry. No rash noted. No erythema. No pallor.       Mental Status Exam: Appearance: Dressed appropriately to the situation.  Tearful  at times. Behavior: Normal Mood: "Depressed and stressed" Affect: Congruent with mood, agitated at times 2/2 to medication plan.  Speech: Normal Thought Process: Normal Thought Content: Normal Insight: Poor Judgement: Poor    ASSESSMENT & PLAN  Anxiety and depression - Plan: ALPRAZolam (XANAX) 0.25 MG tablet, amitriptyline (ELAVIL) 50 MG tablet. Follow up in two weeks. Given the presence of substance abuse in the patient's history, it is prudent to reduce the risk of substance abuse to its minimum.  Therefore, the dose of Elavil was increased to from 25 mg at night to 50 mg each night, with the option of increasing the dose to 100 mg each  night and the dose of alprazolam was reduced to its minimal dosage and frequency, and prescribed for a short duration.   Of note, patient became visibly agitated with dosage of Alprazolam prescribed at today's visit. She then requested a prescription for Valium instead of Xanax.  She was advised that f/u visits would yield improvements to the treatment plan, at which point the patient, who remained agitated, left the room.      Breast lump on right side at 5 o'clock position: Give that there are not signs and symptoms of infection, patient was instructed that no treatment was necessary at this point.  The patient was instructed to to call or comeback to clinic as needed, or should symptoms warrant.  Deliah Boston, MS, PA-C [facility] 06/17/2014 4:41 PM

## 2014-06-21 NOTE — Progress Notes (Signed)
Want to add a note in exam re what you found on breast exam?? And a comment re whether you could express discharge or observe her expressing d/c!!

## 2014-06-25 ENCOUNTER — Inpatient Hospital Stay (HOSPITAL_COMMUNITY)
Admission: AD | Admit: 2014-06-25 | Discharge: 2014-06-25 | Disposition: A | Discharge status: Home / Self Care | Payer: Medicaid Other | Source: Ambulatory Visit | Attending: Obstetrics & Gynecology | Admitting: Obstetrics & Gynecology

## 2014-06-25 DIAGNOSIS — Z7289 Other problems related to lifestyle: Secondary | ICD-10-CM | POA: Insufficient documentation

## 2014-06-25 DIAGNOSIS — F1910 Other psychoactive substance abuse, uncomplicated: Secondary | ICD-10-CM

## 2014-06-25 DIAGNOSIS — Z765 Malingerer [conscious simulation]: Secondary | ICD-10-CM

## 2014-06-25 DIAGNOSIS — F419 Anxiety disorder, unspecified: Secondary | ICD-10-CM | POA: Diagnosis present

## 2014-06-25 DIAGNOSIS — F17210 Nicotine dependence, cigarettes, uncomplicated: Secondary | ICD-10-CM | POA: Diagnosis not present

## 2014-06-25 HISTORY — DX: Malingerer (conscious simulation): Z76.5

## 2014-06-25 NOTE — MAU Provider Note (Signed)
History     CSN: [id number]  Arrival date and time: 06/25/14 [id number]   First Provider Initiated Contact with Patient 06/25/14 2135      Chief Complaint  Patient presents with  . Anxiety   HPI  Shannon Morrison is a 23 y.o. [id number] who presents today asking for a refill on her rx for xanax. She states that she was given 14 .25mg  tablets, and was told to take 2 per day. She states that she has had to take 4 per day because she normally takes 1 mg. She has run out of medication that was given. She has an appointment on 10/19 for FU. She states that she has always has anxiety and per chart review she has a hx of opiate addiction. She refuses to leave a urine for a drug test today, and has attempted to circumvent leaving a urine, and giving her boyfriend her urine cup. She denies any suicidal  or homicidal ideations. She states that she does not want to go back to her PCP, and the psychiatrist in [place] that she was seeing (per care everywhere). She states that she did not get along with that psychiatrist.   Past Medical History  Diagnosis Date  . Panic attack   . Heroin addiction     x 3 years  . Seizures   . HSV antigen DIF positive     Past Surgical History  Procedure Laterality Date  . Appendectomy    . Cesarean section      Family History  Problem Relation Age of Onset  . Cancer Maternal Grandmother     breast    History  Substance Use Topics  . Smoking status: Current Every Day Smoker -- 1.00 packs/day for 11 years    Types: Cigarettes  . Smokeless tobacco: Not on file  . Alcohol Use: No     Comment: OCCASIONAL    Allergies: No Known Allergies  Prescriptions prior to admission  Medication Sig Dispense Refill  . ALPRAZolam (XANAX) 0.25 MG tablet Take 1 tablet (0.25 mg total) by mouth 2 (two) times daily as needed for anxiety.  14 tablet  0  . amitriptyline (ELAVIL) 50 MG tablet Take 1 tablet (50 mg total) by mouth at bedtime. Take 1/2 to 2 tabs at bed time.  30 tablet  0   . hydrOXYzine (ATARAX/VISTARIL) 25 MG tablet Take 1 tablet (25 mg total) by mouth 3 (three) times daily as needed.  30 tablet  0  . prenatal vitamin w/FE, FA (PRENATAL 1 + 1) 27-1 MG TABS tablet Take 1 tablet by mouth daily.  30 each  0  . promethazine (PHENERGAN) 12.5 MG suppository Place 1 suppository (12.5 mg total) rectally every 6 (six) hours as needed for nausea or vomiting.  12 each  0  . promethazine (PHENERGAN) 25 MG tablet Take 1 tablet (25 mg total) by mouth every 6 (six) hours as needed for nausea or vomiting.  30 tablet  0    ROS Physical Exam   Blood pressure 117/75, pulse 101, temperature 98.7 F (37.1 C), temperature source Oral, resp. rate 18, height 5\' 7"  (1.702 m), weight 83.915 kg (185 lb), last menstrual period 06/17/2014, SpO2 98.00%, unknown if currently breastfeeding.  Physical Exam  Nursing note and vitals reviewed. Constitutional: She is oriented to person, place, and time. She appears well-developed and well-nourished. No distress.  Cardiovascular: Normal rate.   Respiratory: Effort normal.  Neurological: She is alert and oriented to person, place, and time.  Skin: Skin is warm.  Psychiatric: She has a normal mood and affect.    MAU Course  Procedures   Assessment and Plan   1. Drug-seeking behavior    Discussed with the patient that I cannot refill her prescription today Referral to Mental health services given Return to MAU as needed   Tawnya CrookHogan, Tavon Magnussen Donovan 06/25/2014, 9:38 PM

## 2014-06-25 NOTE — Discharge Instructions (Signed)
Generalized Anxiety Disorder Generalized anxiety disorder (GAD) is a mental disorder. It interferes with life functions, including relationships, work, and school. GAD is different from normal anxiety, which everyone experiences at some point in their lives in response to specific life events and activities. Normal anxiety actually helps us prepare for and get through these life events and activities. Normal anxiety goes away after the event or activity is over.  GAD causes anxiety that is not necessarily related to specific events or activities. It also causes excess anxiety in proportion to specific events or activities. The anxiety associated with GAD is also difficult to control. GAD can vary from mild to severe. People with severe GAD can have intense waves of anxiety with physical symptoms (panic attacks).  SYMPTOMS The anxiety and worry associated with GAD are difficult to control. This anxiety and worry are related to many life events and activities and also occur more days than not for 6 months or longer. People with GAD also have three or more of the following symptoms (one or more in children):  Restlessness.   Fatigue.  Difficulty concentrating.   Irritability.  Muscle tension.  Difficulty sleeping or unsatisfying sleep. DIAGNOSIS GAD is diagnosed through an assessment by your health care provider. Your health care provider will ask you questions aboutyour mood,physical symptoms, and events in your life. Your health care provider may ask you about your medical history and use of alcohol or drugs, including prescription medicines. Your health care provider may also do a physical exam and blood tests. Certain medical conditions and the use of certain substances can cause symptoms similar to those associated with GAD. Your health care provider may refer you to a mental health specialist for further evaluation. TREATMENT The following therapies are usually used to treat GAD:    Medication. Antidepressant medication usually is prescribed for long-term daily control. Antianxiety medicines may be added in severe cases, especially when panic attacks occur.   Talk therapy (psychotherapy). Certain types of talk therapy can be helpful in treating GAD by providing support, education, and guidance. A form of talk therapy called cognitive behavioral therapy can teach you healthy ways to think about and react to daily life events and activities.  Stress managementtechniques. These include yoga, meditation, and exercise and can be very helpful when they are practiced regularly. A mental health specialist can help determine which treatment is best for you. Some people see improvement with one therapy. However, other people require a combination of therapies. Document Released: 12/25/2012 Document Revised: 01/14/2014 Document Reviewed: 12/25/2012 ExitCare Patient Information 2015 [employer]. This information is not intended to replace advice given to you by your health care provider. Make sure you discuss any questions you have with your health care provider.  

## 2014-06-25 NOTE — MAU Note (Signed)
Pt reports anxiety problems and panic attacks her whole life. States she saw dr Leotis Shamesjeffery green on 10/05 and he gave her Xanax 0.25 but she needs something stronger. States she has always been on 1 mg

## 2014-06-26 ENCOUNTER — Telehealth: Payer: Self-pay

## 2014-06-26 LAB — POCT PREGNANCY, URINE: PREG TEST UR: NEGATIVE

## 2014-06-26 NOTE — Telephone Encounter (Signed)
Pt called in and wanted Dr Neva SeatGreene to give her a call. She did not want to say what she needed to speak to him about. She can be reached [phone]. Thank you

## 2014-06-27 NOTE — Telephone Encounter (Signed)
Pt was written for xanax 0.25mg , she states that it didn't work for her that well. She wants to know if we can increase the dosage. She has medicaid and doesn't have money to come in. Please advise.

## 2014-06-28 NOTE — Telephone Encounter (Signed)
Patient will need to come back to clinic for reevaluation of her depression and anxiety. It was made clear to the patient during last visit that Xanax was prescribed only to allow Elavil time to produce a clinical effect.

## 2014-06-28 NOTE — Telephone Encounter (Signed)
I did not see this patient at last OV.  She was seen by Deliah BostonMichael Clark and it appears Dr. Merla Richesoolittle may have been preceptor. In review of note, I would defer any change in medicines to those providers.

## 2014-06-29 ENCOUNTER — Encounter: Payer: Self-pay | Admitting: Physician Assistant

## 2014-06-29 DIAGNOSIS — Z765 Malingerer [conscious simulation]: Secondary | ICD-10-CM | POA: Insufficient documentation

## 2014-06-29 NOTE — Telephone Encounter (Signed)
Please please call pt with info in previous message.

## 2014-06-29 NOTE — Telephone Encounter (Signed)
Pt is taking 2 Xanax at a time right now. Strongly encouraged pt to RTC.  Pt states she has Medicaid and does not have the money to come in until next week. She states she only has 2 left. She wants to get a refill to hold her until she is able to come in.

## 2014-06-29 NOTE — Telephone Encounter (Signed)
No. A refill of this product is not appropriate.

## 2014-06-30 ENCOUNTER — Telehealth: Payer: Self-pay

## 2014-06-30 NOTE — Telephone Encounter (Signed)
Patient called and message relayed from PA to patient.  She said "OKAY".

## 2014-06-30 NOTE — Telephone Encounter (Signed)
Patient returned call and message relayed by April Barnes. See other phone message.

## 2014-06-30 NOTE — Telephone Encounter (Signed)
Noted  

## 2014-07-03 ENCOUNTER — Other Ambulatory Visit: Payer: Self-pay | Admitting: Physician Assistant

## 2014-07-03 ENCOUNTER — Encounter: Payer: Self-pay | Admitting: Physician Assistant

## 2014-07-15 ENCOUNTER — Encounter: Payer: Self-pay | Admitting: Physician Assistant

## 2014-08-01 ENCOUNTER — Encounter: Payer: Self-pay | Admitting: Obstetrics & Gynecology

## 2014-08-01 ENCOUNTER — Ambulatory Visit (INDEPENDENT_AMBULATORY_CARE_PROVIDER_SITE_OTHER): Payer: Medicaid Other | Admitting: Obstetrics & Gynecology

## 2014-08-01 VITALS — BP 119/62 | HR 95 | Temp 98.3°F | Wt 185.8 lb

## 2014-08-01 DIAGNOSIS — F418 Other specified anxiety disorders: Secondary | ICD-10-CM

## 2014-08-01 DIAGNOSIS — F419 Anxiety disorder, unspecified: Secondary | ICD-10-CM

## 2014-08-01 DIAGNOSIS — Z01419 Encounter for gynecological examination (general) (routine) without abnormal findings: Secondary | ICD-10-CM | POA: Insufficient documentation

## 2014-08-01 DIAGNOSIS — F329 Major depressive disorder, single episode, unspecified: Secondary | ICD-10-CM

## 2014-08-01 DIAGNOSIS — Z30011 Encounter for initial prescription of contraceptive pills: Secondary | ICD-10-CM

## 2014-08-01 DIAGNOSIS — Z0000 Encounter for general adult medical examination without abnormal findings: Secondary | ICD-10-CM

## 2014-08-01 DIAGNOSIS — Z309 Encounter for contraceptive management, unspecified: Secondary | ICD-10-CM | POA: Insufficient documentation

## 2014-08-01 LAB — POCT PREGNANCY, URINE: PREG TEST UR: NEGATIVE

## 2014-08-01 MED ORDER — NORGESTIMATE-ETH ESTRADIOL 0.25-35 MG-MCG PO TABS: 1.0000 | ORAL_TABLET | Freq: Every day | ORAL | Status: AC

## 2014-08-01 MED ORDER — AMITRIPTYLINE HCL 50 MG PO TABS: 25.0000 mg | ORAL_TABLET | Freq: Every day | ORAL | Status: AC

## 2014-08-01 NOTE — Progress Notes (Signed)
   Subjective:    Patient ID: Shannon Morrison, female    DOB: 04/07/1991, 23 y.o.   MRN: [id number]  HPI  Annual Gynecological Exam  Last period: 07/18/2014 Regular periods: yes Heavy bleeding: yes x 10 days, reported heavy flow  Sexually active: yes, with regular condom use Dyspareunia: No Hot flashes: No Vaginal discharge: No Dysuria:No   CONTRACEPTION: - Previously on OCPs last in January 2014, decided to discontinue to get pregnant, last pregnancy 03/29/14 - Reports initial reason for OCPs was to reduce menstrual flow, describes regular monthly cycle with heavy flow x 10 days, significantly improved while on OCPs with lighter bleeding - LMP 07/18/14 - Sexual Hx: 1 partner, active, regular condom use, no reported STDs - Last Pap 10/13/12 negative  History of Depression / Anxiety: - Previously prescribed Xanax and Elavil for this, currently out of these medications, and requesting refills, states that she is currently waiting on Medicaid until she can establish with PCP - Takes Elavil to help both with depression and insomnia  Health Maintenance: Last mammogram: never had Breast mass or concerns: No Last Pap 10/13/12 negative (next due 3 years, 09/2015) History of abnormal pap: No   FH of breast, uterine, ovarian, colon cancer: No  Social Hx: - Active smoker, recently decreased from 2ppd to 0.5mg  ppd, continues to be interested in quitting  Review of Systems  See above HPI    Objective:   Physical Exam  BP 119/62 mmHg  Pulse 95  Temp(Src) 98.3 F (36.8 C)  Wt 185 lb 12.8 oz (84.278 kg)  LMP 07/18/2014  Breastfeeding? No  Gen - well-appearing, cooperative, NAD HEENT - oropharynx clear, MMM Neck - supple, non-tender, no LAD, no thyromegaly Heart - RRR, no murmurs heard Lungs - CTAB, no wheezing, crackles, or rhonchi. Normal work of breathing. Abd - soft, NTND, no masses, +active BS Ext - non-tender, no edema, peripheral pulses intact +2 b/l Skin - warm, dry, no  rashes Neuro - awake, alert     Assessment & Plan:   23 yr [id number] presents for annual well woman exam and to discuss resuming contraception, previously on OCPs since off due to desired pregnancy, recently delivered 03/2014. Additionally PMH depression, anxiety, insomnia, not currently established by PCP, requesting refills  1. New rx Ortho-cyclen 0.25-35mg -mcg tablets, 11 refills, counseled on OCPs 2. Not due for Pap - next due 09/2015 3. Refilled Elavil 25-50mg  qhs PRN. Did not refill Xanax, advised not willing to continue maintenance 4. RTC 1 year for annual wellness  Saralyn PilarAlexander Crystelle Ferrufino, DO [facility], PGY-2

## 2014-08-01 NOTE — Progress Notes (Signed)
Pt is here to get started on birth control

## 2014-08-01 NOTE — Patient Instructions (Signed)
You are overall doing well for your annual well woman exam. With your history and at your age, you are not due for next Pap Smear until January 2017 (every 3 years) Started new prescription for [employer] today - same medicine as before, take as instructed. Do not skip any doses, refills given for 1 year. Also refilled Elavil continue to take as prescribed. Unable to refill Xanax due to clinic policy, recommend that you locate a new primary doctor as soon as you are able to through Medicaid, and follow-up with them to discuss this further.  Recommend to reduce and eventually quit smoking, especially since you are on Birth Control Pills  If you have any concerns or questions, do no hesitate to call [facility], otherwise please schedule an appointment to return in 1 year for next Annual Well Woman Exam.  Oral Contraception Information Oral contraceptive pills (OCPs) are medicines taken to prevent pregnancy. OCPs work by preventing the ovaries from releasing eggs. The hormones in OCPs also cause the cervical mucus to thicken, preventing the sperm from entering the uterus. The hormones also cause the uterine lining to become thin, not allowing a fertilized egg to attach to the inside of the uterus. OCPs are highly effective when taken exactly as prescribed. However, OCPs do not prevent sexually transmitted diseases (STDs). Safe sex practices, such as using condoms along with the pill, can help prevent STDs.  Before taking the pill, you may have a physical exam and Pap test. Your health care provider may order blood tests. The health care provider will make sure you are a good candidate for oral contraception. Discuss with your health care provider the possible side effects of the OCP you may be prescribed. When starting an OCP, it can take 2 to 3 months for the body to adjust to the changes in hormone levels in your body.  TYPES OF ORAL CONTRACEPTION  The combination pill--This pill contains  estrogen and progestin (synthetic progesterone) hormones. The combination pill comes in 21-day, 28-day, or 91-day packs. Some types of combination pills are meant to be taken continuously (365-day pills). With 21-day packs, you do not take pills for 7 days after the last pill. With 28-day packs, the pill is taken every day. The last 7 pills are without hormones. Certain types of pills have more than 21 hormone-containing pills. With 91-day packs, the first 84 pills contain both hormones, and the last 7 pills contain no hormones or contain estrogen only.  The minipill--This pill contains the progesterone hormone only. The pill is taken every day continuously. It is very important to take the pill at the same time each day. The minipill comes in packs of 28 pills. All 28 pills contain the hormone.  ADVANTAGES OF ORAL CONTRACEPTIVE PILLS  Decreases premenstrual symptoms.   Treats menstrual period cramps.   Regulates the menstrual cycle.   Decreases a heavy menstrual flow.   May treatacne, depending on the type of pill.   Treats abnormal uterine bleeding.   Treats polycystic ovarian syndrome.   Treats endometriosis.   Can be used as emergency contraception.  THINGS THAT CAN MAKE ORAL CONTRACEPTIVE PILLS LESS EFFECTIVE OCPs can be less effective if:   You forget to take the pill at the same time every day.   You have a stomach or intestinal disease that lessens the absorption of the pill.   You take OCPs with other medicines that make OCPs less effective, such as antibiotics, certain HIV medicines, and some seizure  medicines.   You take expired OCPs.   You forget to restart the pill on day 7, when using the packs of 21 pills.  RISKS ASSOCIATED WITH ORAL CONTRACEPTIVE PILLS  Oral contraceptive pills can sometimes cause side effects, such as:  Headache.  Nausea.  Breast tenderness.  Irregular bleeding or spotting. Combination pills are also associated with a small  increased risk of:  Blood clots.  Heart attack.  Stroke. Document Released: 11/20/2002 Document Revised: 06/20/2013 Document Reviewed: 02/18/2013 [facility] Patient Information 2015 [place], [place]. This information is not intended to replace advice given to you by your health care provider. Make sure you discuss any questions you have with your health care provider.

## 2014-08-01 NOTE — Progress Notes (Signed)
Attestation of Attending Supervision of Resident: Evaluation and management procedures were performed by the [facility]ident under my supervision.  I have seen and examined the patient, reviewed the resident's note and chart, and I agree with the management and plan.  Anibal Hendersonarolyn L Harraway-Smith, M.D. 08/01/2014 3:51 PM

## 2014-08-17 ENCOUNTER — Emergency Department (HOSPITAL_COMMUNITY)
Admission: EM | Admit: 2014-08-17 | Discharge: 2014-08-17 | Disposition: A | Discharge status: Home / Self Care | Payer: Medicaid Other | Attending: Emergency Medicine | Admitting: Emergency Medicine

## 2014-08-17 ENCOUNTER — Encounter (HOSPITAL_COMMUNITY): Payer: Self-pay | Admitting: *Deleted

## 2014-08-17 DIAGNOSIS — F1110 Opioid abuse, uncomplicated: Secondary | ICD-10-CM | POA: Diagnosis not present

## 2014-08-17 DIAGNOSIS — Z79899 Other long term (current) drug therapy: Secondary | ICD-10-CM | POA: Insufficient documentation

## 2014-08-17 DIAGNOSIS — Z8619 Personal history of other infectious and parasitic diseases: Secondary | ICD-10-CM | POA: Insufficient documentation

## 2014-08-17 DIAGNOSIS — F410 Panic disorder [episodic paroxysmal anxiety] without agoraphobia: Secondary | ICD-10-CM | POA: Diagnosis not present

## 2014-08-17 DIAGNOSIS — Z720 Tobacco use: Secondary | ICD-10-CM | POA: Diagnosis not present

## 2014-08-17 MED ORDER — LORAZEPAM 1 MG PO TABS
1.0000 mg | ORAL_TABLET | Freq: Once | ORAL | Status: AC
  Administered 2014-08-17: 1 mg via ORAL
  Filled 2014-08-17: qty 1

## 2014-08-17 MED ORDER — ONDANSETRON HCL 4 MG PO TABS: 4.0000 mg | ORAL_TABLET | Freq: Four times a day (QID) | ORAL | Status: AC

## 2014-08-17 NOTE — ED Notes (Signed)
Pt reports she has a ride home.

## 2014-08-17 NOTE — ED Notes (Signed)
Discharge instructions reviewed with pt. Pt verbalized understanding.   

## 2014-08-17 NOTE — ED Provider Notes (Signed)
CSN: [id number]     Arrival date & time 08/17/14  1823 History   First MD Initiated Contact with Patient 08/17/14 1935     Chief Complaint  Patient presents with  . wants detox      (Consider location/radiation/quality/duration/timing/severity/associated sxs/prior Treatment) HPI   Patient to the ER requesting detox from [employer]. She last used around 8am this morning and has been using for the past 4 years, except was able to quit for 5 months when she was pregnant. She is upset that we no longer offer inpatient detox from heroine and would like to leave and go to a hospital that does. She was cooperative. She denies HI/SI or hallucinations. She says she knows she can't detox if she isn't inpatient. Pt has no obvious signs of injury. Denies any medical complaints at this time.  Past Medical History  Diagnosis Date  . Panic attack   . Heroin addiction     x 3 years  . Seizures   . HSV antigen DIF positive   . Drug-seeking behavior 06/25/2014   Past Surgical History  Procedure Laterality Date  . Appendectomy    . Cesarean section     Family History  Problem Relation Age of Onset  . Cancer Maternal Grandmother     breast   History  Substance Use Topics  . Smoking status: Current Every Day Smoker -- 1.00 packs/day for 11 years    Types: Cigarettes  . Smokeless tobacco: Not on file  . Alcohol Use: No     Comment: OCCASIONAL   OB History    Gravida Para Term Preterm AB TAB SAB Ectopic Multiple Living   4 2 2  1 1    2      Review of Systems  10 Systems reviewed and are negative for acute change except as noted in the HPI.     Allergies  Review of patient's allergies indicates no known allergies.  Home Medications   Prior to Admission medications   Medication Sig Start Date End Date Taking? Authorizing Provider  ALPRAZolam (XANAX) 0.25 MG tablet Take 1 tablet (0.25 mg total) by mouth 2 (two) times daily as needed for anxiety. 06/17/14   Dolores LoryMichael Lee Clark, PA-C   amitriptyline (ELAVIL) 50 MG tablet Take 0.5-1 tablets (25-50 mg total) by mouth at bedtime. 08/01/14   Saralyn PilarAlexander Karamalegos, DO  hydrOXYzine (ATARAX/VISTARIL) 25 MG tablet Take 1 tablet (25 mg total) by mouth 3 (three) times daily as needed. 02/25/14   Dorathy KinsmanVirginia Smith, CNM  norgestimate-ethinyl estradiol (ORTHO-CYCLEN, 28,) 0.25-35 MG-MCG tablet Take 1 tablet by mouth daily. 08/01/14   Saralyn PilarAlexander Karamalegos, DO  prenatal vitamin w/FE, FA (PRENATAL 1 + 1) 27-1 MG TABS tablet Take 1 tablet by mouth daily. 02/27/14   Deirdre Colin Mulders Poe, CNM  promethazine (PHENERGAN) 12.5 MG suppository Place 1 suppository (12.5 mg total) rectally every 6 (six) hours as needed for nausea or vomiting. 01/05/14   Minta BalsamMichael R Odom, MD  promethazine (PHENERGAN) 25 MG tablet Take 1 tablet (25 mg total) by mouth every 6 (six) hours as needed for nausea or vomiting. 01/05/14   Saralyn PilarAlexander Karamalegos, DO   BP 115/67 mmHg  Pulse 104  Temp(Src) 97.5 F (36.4 C)  Resp 18  SpO2 99%  LMP 07/18/2014 Physical Exam  Constitutional: She appears well-developed and well-nourished. No distress.  HENT:  Head: Normocephalic and atraumatic.  Eyes: Pupils are equal, round, and reactive to light.  Neck: Normal range of motion. Neck supple.  Cardiovascular: Normal rate and  regular rhythm.   Pulmonary/Chest: Effort normal.  Abdominal: Soft.  Neurological: She is alert.  Skin: Skin is warm and dry.  Psychiatric: Her speech is normal. Her mood appears not anxious. She is not withdrawn and not actively hallucinating. Thought content is not delusional. She does not exhibit a depressed mood. She expresses no homicidal and no suicidal ideation. She expresses no suicidal plans and no homicidal plans.  Nursing note and vitals reviewed.   ED Course  Procedures (including critical care time) Labs Review Labs Reviewed - No data to display  Imaging Review No results found.   EKG Interpretation None      MDM   Final diagnoses:  Heroin abuse    Patient has no SI/HI and no medical concerns. Her vital signs are stable. She has been given outpatient and inpatient resources for her to follow-up on. Pt [facility].  23 y.o.Duayne CalCasey K Arcia's evaluation in the Emergency Department is complete. It has been determined that no acute conditions requiring further emergency intervention are present at this time. The patient/guardian have been advised of the diagnosis and plan. We have discussed signs and symptoms that warrant return to the ED, such as changes or worsening in symptoms.  Vital signs are stable at discharge. Filed Vitals:   08/17/14 1830  BP: 115/67  Pulse: 104  Temp: 97.5 F (36.4 C)  Resp: 18    Patient/guardian has voiced understanding and agreed to follow-up with the PCP or specialist.      Dorthula Matasiffany G Emrey Thornley, PA-C 08/17/14 2002  Richardean Canalavid H Yao, MD 08/18/14 [phone]

## 2014-08-17 NOTE — ED Notes (Signed)
Pt here requesting detox from heroine and cocaine. Pt last used around 0800 this morning. Pt was made aware that we no longer offer detox services from opiates and given resource guide for facilities that do. Pt upset and wanting to go to another hospital that does do detox. Pt got upset and attempted to leave. Youssef Footman PA made aware and is in room speaking with pt.

## 2014-08-17 NOTE — Discharge Instructions (Signed)
Opioid Withdrawal Opioids are a group of narcotic drugs. They include the street drug heroin. They also include pain medicines, such as morphine, hydrocodone, oxycodone, and fentanyl. Opioid withdrawal is a group of characteristic physical and mental signs and symptoms. It typically occurs if you have been using opioids daily for several weeks or longer and stop using or rapidly decrease use. Opioid withdrawal can also occur if you have used opioids daily for a long time and are given a medicine to block the effect.  SIGNS AND SYMPTOMS Opioid withdrawal includes three or more of the following symptoms:   Depressed, anxious, or irritable mood.  Nausea or vomiting.  Muscle aches or spasms.   Watery eyes.   Runny nose.  Dilated pupils, sweating, or hairs standing on end.  Diarrhea or intestinal cramping.  Yawning.   Fever.  Increased blood pressure.  Fast pulse.  Restlessness or trouble sleeping. These signs and symptoms occur within several hours of stopping or reducing short-acting opioids, such as heroin. They can occur within 3 days of stopping or reducing long-acting opioids, such as methadone. Withdrawal begins within minutes of receiving a drug that blocks the effects of opioids, such as naltrexone or naloxone. DIAGNOSIS  Opioid use disorder is diagnosed by your health care provider. You will be asked about your symptoms, drug and alcohol use, medical history, and use of medicines. A physical exam may be done. Lab tests may be ordered. Your health care provider may have you see a mental health professional.  TREATMENT  The treatment for opioid withdrawal is usually provided by medical doctors with special training in substance use disorders (addiction specialists). The following medicines may be included in treatment:  Opioids given in place of the abused opioid. They turn on opioid receptors in the brain and lessen or prevent withdrawal symptoms. They are gradually  decreased (opioid substitution and taper).  Non-opioids that can lessen certain opioid withdrawal symptoms. They may be used alone or with opioid substitution and taper. Successful long-term recovery usually requires medicine, counseling, and group support. HOME CARE INSTRUCTIONS   Take medicines only as directed by your health care provider.  Check with your health care provider before starting new medicines.  Keep all follow-up visits as directed by your health care provider. SEEK MEDICAL CARE IF:  You are not able to take your medicines as directed.  Your symptoms get worse.  You relapse. SEEK IMMEDIATE MEDICAL CARE IF:  You have serious thoughts about hurting yourself or others.  You have a seizure.  You lose consciousness. Document Released: 09/02/2003 Document Revised: 01/14/2014 Document Reviewed: 09/12/2013 [facility] Patient Information 2015 [place], [place]. This information is not intended to replace advice given to you by your health care provider. Make sure you discuss any questions you have with your health care provider.   RESOURCE GUIDE  Chronic Pain Problems: Contact [facility]  [phone] Patients need to be referred by their primary care doctor.  Insufficient Money for Medicine: Contact United Way:  call "211" or [place] [phone].  No Primary Care Doctor: Call Health Connect  [phone] - can help you locate a primary care doctor that  accepts your insurance, provides certain services, etc. Physician Referral Service- [phone]  Agencies that provide inexpensive medical care: [facility]  [facility]  [phone] [facility]  [phone] [facility]  [phone] Planned Parenthood  [phone] [facility]  [phone]  [place] Providers: [facility]  Cl[facility]- [address], [address]  [phone], Mon-Fri 9am-7pm, Sat  9am-1pm [place], Suite [place], Suite [place]  [place]- [address]  [place], [address], [phone]  Only accepts [place] patients after they have their name  applied to their card  Self Pay (no insurance) in [facility]: Sickle Cell Patients: Dr Kevan Ny, [facility]  [place], [facility]- [place]  [facility]- [address], [place] [facility]- see information above (Speak to [employer] if you do not have insurance)       -  [facility]- [place], [place],  [place], [place]  Dr Vista Lawman-  [address], [address], [place], [place] [facility]- [address], [phone]       -  [facility] [place]- [address], [place], also [address], [phone]       -    [facility]- [address], [place], 1st & 3rd Saturday   every month, 10am-1pm  1) Find a Doctor and Pay Out of Pocket Although you won't have to find out who is covered by your insurance plan, it is a good idea to ask around and get recommendations. You will then need to call the office and see if the doctor you have chosen will accept you as a new patient and what types of options they offer for patients who are self-pay. Some doctors offer discounts or will set up payment plans for their patients who do not have insurance, but you will need to ask so you aren't surprised when you get to your appointment.  2) Contact Your Local Health Department Not all health departments have doctors that can see patients for sick visits,  but many do, so it is worth a call to see if yours does. If you don't know where your local health department is, you can check in your phone book. The CDC also has a tool to help you locate your state's health department, and many state websites also have listings of all of their local health departments.  3) Find a [facility] If your illness is not likely to be very severe or complicated, you may want to try a walk in clinic. These are popping up all over the country in pharmacies, drugstores, and shopping centers. They're usually staffed by nurse practitioners or physician assistants that have been trained to treat common illnesses and complaints. They're usually fairly quick and inexpensive. However, if you have serious medical issues or chronic medical problems, these are probably not your best option  [facility] [place], [facility], [address], [place], phone [phone] or [phone].  Monday - Friday, call for an appointment. [place], [facility], [address], [place], phone [phone] or [phone].  Monday - Friday, call for an appointment.  Abuse/Neglect: [place] [phone] [place] [phone] (After Hours)  Emergency Shelter:  [facility] [phone]  Maternity Homes: Room at the [place] [phone] [place] [phone]  MRSA Hotline #:   306-395-1832  [facility] [place] Dept. [address]         [place] Phone:  [phone]                                  Phone:  [phone]                   Phone:  [phone]  [facility], [place]- [phone]       -     [facility] in [place], [address]                                  [place] [phone] or [phone] (After Hours)   [place]  Substance Abuse Resources: Alcohol and Drug Services  [phone] [place] [phone] [facility] [place]a Pester [phone] Residential & Outpatient Substance Abuse Program  9844727224  Psychological Services: [place]  [phone] [place]  [place], [place]. [address] [place], [place]: [phone] or [phone], [email or web address]  Dental Assistance  If unable to pay or uninsured, contact:  [facility] or [facility]. to become qualified for the adult dental clinic.  Patients with Medicaid: [facility] [phone] [address] [place], [place] [address] [phone]  If unable to pay, or uninsured, contact [facility] [phone]) or [place] [phone] in [place], [place] in [facility]) to become qualified for the adult dental clinic  Other [place]- [place], [place], [place], [address], [place], [place], 2nd and 4th Thursday of the month at 6:30am.  10 clients each day by appointment, can sometimes see walk-in patients if someone does not show for an appointment. [facility]- [address] [place] Danker [place], [place], [address], [place], [place], [place], [address], [place] [facility] [phone]  Please make every  effort to establish with a primary care physician for routine medical care  [place]  The [place] provides a wide range of adult  health services. Some of these services are designed to address the healthcare needs of all [facility] residents and all services are designed to meet the needs of uninsured/underinsured low income residents. Some services are available to any resident of [place], call [phone] for details. ] The [facility], a new medical clinic for adults, is now open. For more information about the Center and its services please call [phone]. For information on our [place] services, click here.  For more information on any of the following Department of Public Health programs, including hours of service, click on the highlighted link.  SERVICES FOR WOMEN (Adults and Teens) [employer] provide a full range of birth control options plus education and counseling. New patient visit and annual return visits include a complete examination, pap test as indicated, and other laboratory as indicated. Included is our [employer] for men.  Maternity Care is provided through pregnancy, including a six week post partum exam. Women who meet eligibility criteria for the Medicaid for Pregnant Women program, receive care free. Other women are charged on a sliding scale according to income. Note: [facility] provides services to pregnant women who have a Medicaid card. Call [phone] for an appointment in [place] or [phone] for an appointment in [facility].  Primary Care for Medicaid [place] Access Women is available through the [place]. As primary care provider for the [place] program, women may designate the [facility] clinic as their primary care provider.  PLEASE CALL [phone]  FOR AN APPOINTMENT FOR THE ABOVE SERVICES IN EITHER [place] OR [place]. Information available in [place] and [place].   Childbirth Education Classes are open to the public and offered to help families prepare for the best possible childbirth experience as well as to promote lifelong health and wellness. Classes are offered throughout the year and meet on the same night once a week for five weeks. Medicaid covers the cost of the classes for the mother-to-be and her partner. For participants without Medicaid, the cost of the class series is $45.00 for the mother-to-be and her partner. Class size is limited and registration is required. For more information or to register call [phone]. Baby items donated by [employer] and the [employer] [place] are given away during each class series.  SERVICES FOR WOMEN AND MEN Sexually Transmitted Infection appointments, including HIV testing, are available daily (weekdays, except holidays). Call early as same-day appointments are limited. For an appointment in either [facility] or [place], call [phone]. Services are confidential and free of charge.  Skin Testing for Tuberculosis Please call [phone]. Adult Immunizations are available, usually for a fee. Please call [phone] for details.  PLEASE CALL [phone] FOR AN APPOINTMENT FOR THE ABOVE SERVICES IN EITHER [place] OR [place].   [facility] provides up to the minute recommended vaccines for your travel destination. We also provide essential health and political information to help insure a safe and pleasurable travel experience. This program is self-sustaining, however, fees are very competitive. We are a CERTIFIED YELLOW FEVER IMMUNIZATION approved clinic site. PLEASE CALL [phone] FOR AN APPOINTMENT IN EITHER [place] OR [place].   If you have questions about the services listed above, we want to answer them! Email [place] at: [email or web address] Home Visiting  Services for elderly and the disabled are available to residents of [facility] who are in need of care that compares to the care  offered by a nursing home, have needs that can be met by the program, and have [place] Medicaid. Other short term services are available to residents 18 years and older who are unable to meet requirements for eligibility to receive services from a certified home health agency, spend the majority of time at home, and need care for six months or less.  PLEASE CALL [phone] OR [phone] FOR MORE INFORMATION. Medication Assistance Program serves as a link between pharmaceutical companies and patients to provide low cost or free prescription medications. This servce is available for residents who meet certain income restrictions and have no insurance coverage.  PLEASE CALL [phone] ([place]) OR [phone] ([place]) FOR MORE INFORMATION.  Updated Feb. 21, 2013

## 2014-08-17 NOTE — ED Notes (Signed)
The pt is requesting  Detox from heroin and cocaine.  Her drug of choice is heroin.  Her last heroin was  0830am today.  lmp  Due now

## 2014-08-26 ENCOUNTER — Other Ambulatory Visit: Payer: Self-pay | Admitting: Physician Assistant

## 2014-11-26 ENCOUNTER — Emergency Department (HOSPITAL_COMMUNITY)
Admission: EM | Admit: 2014-11-26 | Discharge: 2014-11-26 | Disposition: A | Discharge status: Home / Self Care | Payer: No Typology Code available for payment source | Attending: Emergency Medicine | Admitting: Emergency Medicine

## 2014-11-26 ENCOUNTER — Encounter (HOSPITAL_COMMUNITY): Payer: Self-pay

## 2014-11-26 DIAGNOSIS — Z79899 Other long term (current) drug therapy: Secondary | ICD-10-CM | POA: Diagnosis not present

## 2014-11-26 DIAGNOSIS — T7421XA Adult sexual abuse, confirmed, initial encounter: Secondary | ICD-10-CM | POA: Insufficient documentation

## 2014-11-26 DIAGNOSIS — Z793 Long term (current) use of hormonal contraceptives: Secondary | ICD-10-CM | POA: Insufficient documentation

## 2014-11-26 DIAGNOSIS — Z3202 Encounter for pregnancy test, result negative: Secondary | ICD-10-CM | POA: Diagnosis not present

## 2014-11-26 DIAGNOSIS — Z720 Tobacco use: Secondary | ICD-10-CM | POA: Diagnosis not present

## 2014-11-26 DIAGNOSIS — Z8619 Personal history of other infectious and parasitic diseases: Secondary | ICD-10-CM | POA: Insufficient documentation

## 2014-11-26 DIAGNOSIS — F410 Panic disorder [episodic paroxysmal anxiety] without agoraphobia: Secondary | ICD-10-CM | POA: Insufficient documentation

## 2014-11-26 LAB — POC URINE PREG, ED: Preg Test, Ur: NEGATIVE

## 2014-11-26 MED ORDER — CEFIXIME 400 MG PO TABS
ORAL_TABLET | ORAL | Status: AC
  Administered 2014-11-26: 400 mg
  Filled 2014-11-26: qty 1

## 2014-11-26 MED ORDER — PROMETHAZINE HCL 25 MG PO TABS
ORAL_TABLET | ORAL | Status: AC
  Administered 2014-11-26: 25 mg
  Filled 2014-11-26: qty 3

## 2014-11-26 MED ORDER — AZITHROMYCIN 1 G PO PACK
PACK | ORAL | Status: AC
  Administered 2014-11-26: 1 g
  Filled 2014-11-26: qty 1

## 2014-11-26 MED ORDER — ULIPRISTAL ACETATE 30 MG PO TABS
ORAL_TABLET | ORAL | Status: AC
Start: 2014-11-26 — End: 2014-11-26
  Administered 2014-11-26: 30 mg
  Filled 2014-11-26: qty 1

## 2014-11-26 MED ORDER — METRONIDAZOLE 500 MG PO TABS
ORAL_TABLET | ORAL | Status: AC
  Administered 2014-11-26: 2000 mg
  Filled 2014-11-26: qty 4

## 2014-11-26 NOTE — ED Notes (Signed)
[place] [employer] was called to pick up kit, they said it would be after 6am before they came to get the kit and take patient home.

## 2014-11-26 NOTE — ED Notes (Signed)
HPD here to take patient home
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
## 2015-08-12 ENCOUNTER — Encounter (HOSPITAL_BASED_OUTPATIENT_CLINIC_OR_DEPARTMENT_OTHER): Payer: Self-pay | Admitting: *Deleted

## 2015-08-12 ENCOUNTER — Emergency Department (HOSPITAL_BASED_OUTPATIENT_CLINIC_OR_DEPARTMENT_OTHER)
Admission: EM | Admit: 2015-08-12 | Discharge: 2015-08-12 | Disposition: A | Discharge status: Home / Self Care | Payer: No Typology Code available for payment source | Attending: Emergency Medicine | Admitting: Emergency Medicine

## 2015-08-12 DIAGNOSIS — F1210 Cannabis abuse, uncomplicated: Secondary | ICD-10-CM | POA: Insufficient documentation

## 2015-08-12 DIAGNOSIS — N390 Urinary tract infection, site not specified: Secondary | ICD-10-CM | POA: Insufficient documentation

## 2015-08-12 DIAGNOSIS — F1910 Other psychoactive substance abuse, uncomplicated: Secondary | ICD-10-CM

## 2015-08-12 DIAGNOSIS — R4182 Altered mental status, unspecified: Secondary | ICD-10-CM | POA: Insufficient documentation

## 2015-08-12 DIAGNOSIS — F17210 Nicotine dependence, cigarettes, uncomplicated: Secondary | ICD-10-CM | POA: Insufficient documentation

## 2015-08-12 DIAGNOSIS — Z3202 Encounter for pregnancy test, result negative: Secondary | ICD-10-CM | POA: Insufficient documentation

## 2015-08-12 DIAGNOSIS — F410 Panic disorder [episodic paroxysmal anxiety] without agoraphobia: Secondary | ICD-10-CM | POA: Insufficient documentation

## 2015-08-12 DIAGNOSIS — Z79899 Other long term (current) drug therapy: Secondary | ICD-10-CM | POA: Insufficient documentation

## 2015-08-12 DIAGNOSIS — F1310 Sedative, hypnotic or anxiolytic abuse, uncomplicated: Secondary | ICD-10-CM | POA: Insufficient documentation

## 2015-08-12 DIAGNOSIS — F1410 Cocaine abuse, uncomplicated: Secondary | ICD-10-CM | POA: Insufficient documentation

## 2015-08-12 LAB — COMPREHENSIVE METABOLIC PANEL
ALK PHOS: 101 U/L (ref 38–126)
AST: 58 U/L — AB (ref 15–41)
Albumin: 4.8 g/dL (ref 3.5–5.0)
Anion gap: 21 — ABNORMAL HIGH (ref 5–15)
BUN: 16 mg/dL (ref 6–20)
CALCIUM: 9.7 mg/dL (ref 8.9–10.3)
CHLORIDE: 100 mmol/L — AB (ref 101–111)
CO2: 19 mmol/L — AB (ref 22–32)
CREATININE: 1.1 mg/dL — AB (ref 0.44–1.00)
Glucose, Bld: 69 mg/dL (ref 65–99)
Potassium: 3.1 mmol/L — ABNORMAL LOW (ref 3.5–5.1)
SODIUM: 140 mmol/L (ref 135–145)
Total Bilirubin: 1.1 mg/dL (ref 0.3–1.2)
Total Protein: 9.4 g/dL — ABNORMAL HIGH (ref 6.5–8.1)

## 2015-08-12 LAB — URINALYSIS, ROUTINE W REFLEX MICROSCOPIC
GLUCOSE, UA: NEGATIVE mg/dL
Ketones, ur: 15 mg/dL — AB
Nitrite: POSITIVE — AB
PH: 5.5 (ref 5.0–8.0)
Protein, ur: 30 mg/dL — AB
SPECIFIC GRAVITY, URINE: 1.028 (ref 1.005–1.030)

## 2015-08-12 LAB — CBC WITH DIFFERENTIAL/PLATELET
BASOS ABS: 0 10*3/uL (ref 0.0–0.1)
BASOS PCT: 0 %
EOS ABS: 0 10*3/uL (ref 0.0–0.7)
EOS PCT: 0 %
HCT: 46.8 % — ABNORMAL HIGH (ref 36.0–46.0)
HEMOGLOBIN: 14.8 g/dL (ref 12.0–15.0)
LYMPHS ABS: 3.3 10*3/uL (ref 0.7–4.0)
Lymphocytes Relative: 22 %
MCH: 27.1 pg (ref 26.0–34.0)
MCHC: 31.6 g/dL (ref 30.0–36.0)
MCV: 85.7 fL (ref 78.0–100.0)
Monocytes Absolute: 0.9 10*3/uL (ref 0.1–1.0)
Monocytes Relative: 6 %
NEUTROS PCT: 72 %
Neutro Abs: 10.7 10*3/uL — ABNORMAL HIGH (ref 1.7–7.7)
PLATELETS: 422 10*3/uL — AB (ref 150–400)
RBC: 5.46 MIL/uL — AB (ref 3.87–5.11)
RDW: 17.1 % — ABNORMAL HIGH (ref 11.5–15.5)
WBC: 15 10*3/uL — AB (ref 4.0–10.5)

## 2015-08-12 LAB — RAPID URINE DRUG SCREEN, HOSP PERFORMED
AMPHETAMINES: NOT DETECTED
BARBITURATES: NOT DETECTED
BENZODIAZEPINES: POSITIVE — AB
Cocaine: POSITIVE — AB
Opiates: NOT DETECTED
TETRAHYDROCANNABINOL: POSITIVE — AB

## 2015-08-12 LAB — URINE MICROSCOPIC-ADD ON

## 2015-08-12 LAB — PREGNANCY, URINE: PREG TEST UR: NEGATIVE

## 2015-08-12 LAB — CBG MONITORING, ED: GLUCOSE-CAPILLARY: 86 mg/dL (ref 65–99)

## 2015-08-12 LAB — ETHANOL: Alcohol, Ethyl (B): <5 mg/dL (ref <5)

## 2015-08-12 MED ORDER — DEXTROSE 5 % IV SOLN
1.0000 g | Freq: Once | INTRAVENOUS | Status: AC
  Administered 2015-08-12: 1 g via INTRAVENOUS

## 2015-08-12 MED ORDER — NALOXONE HCL 0.4 MG/ML IJ SOLN
INTRAMUSCULAR | Status: AC
  Filled 2015-08-12: qty 2

## 2015-08-12 MED ORDER — CEPHALEXIN 500 MG PO CAPS: 500.0000 mg | ORAL_CAPSULE | Freq: Two times a day (BID) | ORAL | Status: AC

## 2015-08-12 MED ORDER — CEFTRIAXONE SODIUM 1 G IJ SOLR
INTRAMUSCULAR | Status: AC
  Filled 2015-08-12: qty 10
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
## 2015-08-12 NOTE — ED Provider Notes (Signed)
CSN: [id number]     Arrival date & time 08/12/15  1212 History   First MD Initiated Contact with Patient 08/12/15 1231     Chief Complaint  Patient presents with  . Altered Mental Status    Level V caveat due to altered mental status  Patient is a 24 y.o. female presenting with altered mental status. The history is provided by the patient and a significant other.  Altered Mental Status  patient presents reportedly brought in by her boyfriend because she called him and said anything to go to the hospital. She was picked up at a hotel. During the drive here she apparently began drooling and also questionable seizure. Patient reportedly does not remember this. She denies drug use but states she was at a club a couple days ago and doesn't know what happened there. She states she is just here because she needs a checkup. She does not remember the call to her boyfriend. She appears altered and cannot produce pain much in the history.  Past Medical History  Diagnosis Date  . Panic attack   . Heroin addiction (HCC)     x 3 years  . Seizures (HCC)   . HSV antigen DIF positive   . Drug-seeking behavior 06/25/2014   Past Surgical History  Procedure Laterality Date  . Appendectomy    . Cesarean section     Family History  Problem Relation Age of Onset  . Cancer Maternal Grandmother     breast   Social History  Substance Use Topics  . Smoking status: Current Every Day Smoker -- 1.00 packs/day for 11 years    Types: Cigarettes  . Smokeless tobacco: None  . Alcohol Use: No     Comment: OCCASIONAL   OB History    Gravida Para Term Preterm AB TAB SAB Ectopic Multiple Living   Review of Systems  Unable to perform ROS: Mental status change      Allergies  Review of patient's allergies indicates no known allergies.  Home Medications   Prior to Admission medications   Medication Sig Start Date End Date Taking? Authorizing Provider  amitriptyline (ELAVIL) 50  MG tablet Take 0.5-1 tablets (25-50 mg total) by mouth at bedtime. 08/01/14  Yes Alexander J Karamalegos, DO  cephALEXin (KEFLEX) 500 MG capsule Take 1 capsule (500 mg total) by mouth 2 (two) times daily. 08/12/15   Benjiman Core, MD  hydrOXYzine (ATARAX/VISTARIL) 25 MG tablet Take 1 tablet (25 mg total) by mouth 3 (three) times daily as needed. Patient not taking: Reported on 11/26/2014 02/25/14   Dorathy Kinsman, CNM  norgestimate-ethinyl estradiol (ORTHO-CYCLEN, 28,) 0.25-35 MG-MCG tablet Take 1 tablet by mouth daily. Patient not taking: Reported on 11/26/2014 08/01/14   Netta Neat Karamalegos, DO  ondansetron (ZOFRAN) 4 MG tablet Take 1 tablet (4 mg total) by mouth every 6 (six) hours. Patient not taking: Reported on 11/26/2014 08/17/14   Marlon Pel, PA-C  prenatal vitamin w/FE, FA (PRENATAL 1 + 1) 27-1 MG TABS tablet Take 1 tablet by mouth daily. Patient not taking: Reported on 11/26/2014 02/27/14   Deirdre C Poe, CNM  promethazine (PHENERGAN) 12.5 MG suppository Place 1 suppository (12.5 mg total) rectally every 6 (six) hours as needed for nausea or vomiting. Patient not taking: Reported on 11/26/2014 01/05/14   Minta Balsam, MD  promethazine (PHENERGAN) 25 MG tablet Take 1 tablet (25 mg total) by mouth every 6 (six)  hours as needed for nausea or vomiting. Patient not taking: Reported on 11/26/2014 01/05/14   Netta Neat Karamalegos, DO   BP 104/68 mmHg  Pulse 98  Temp(Src) 98.5 F (36.9 C) (Oral)  Resp 20  Ht  (1.702 m)  Wt 150 lb (68.04 kg)  BMI 23.49 kg/m2  SpO2 100%  LMP 07/29/2015 (Exact Date) Physical Exam  Constitutional: She appears well-developed.  HENT:  Head: Atraumatic.  Eyes: Pupils are equal, round, and reactive to light.  Mildly dilated pupils  Neck: Neck supple.  Cardiovascular: Normal rate.   Pulmonary/Chest: Effort normal.  Abdominal: Soft. There is no tenderness.  Neurological: She is alert.  Some confusion. Does not remember the events leading up to  the hospital visit. Can answer questions and no she is here but thinks she came in because she needs a physical and to have everything checked out.  Skin: Skin is warm.  Psychiatric:  Somewhat strange mood    ED Course  Procedures (including critical care time) Labs Review Labs Reviewed  COMPREHENSIVE METABOLIC PANEL - Abnormal; Notable for the following:    Potassium 3.1 (*)    Chloride 100 (*)    CO2 19 (*)    Creatinine, Ser 1.10 (*)    Total Protein 9.4 (*)    AST 58 (*)    ALT <5 (*)    Anion gap 21 (*)    All other components within normal limits  URINALYSIS, ROUTINE W REFLEX MICROSCOPIC (NOT AT [facility]) - Abnormal; Notable for the following:    Color, Urine AMBER (*)    APPearance CLOUDY (*)    Hgb urine dipstick MODERATE (*)    Bilirubin Urine SMALL (*)    Ketones, ur 15 (*)    Protein, ur 30 (*)    Nitrite POSITIVE (*)    Leukocytes, UA SMALL (*)    All other components within normal limits  URINE RAPID DRUG SCREEN, HOSP PERFORMED - Abnormal; Notable for the following:    Cocaine POSITIVE (*)    Benzodiazepines POSITIVE (*)    Tetrahydrocannabinol POSITIVE (*)    All other components within normal limits  CBC WITH DIFFERENTIAL/PLATELET - Abnormal; Notable for the following:    WBC 15.0 (*)    RBC 5.46 (*)    HCT 46.8 (*)    RDW 17.1 (*)    Platelets 422 (*)    Neutro Abs 10.7 (*)    All other components within normal limits  URINE MICROSCOPIC-ADD ON - Abnormal; Notable for the following:    Squamous Epithelial / LPF 0-5 (*)    Bacteria, UA MANY (*)    Casts HYALINE CASTS (*)    All other components within normal limits  ETHANOL  PREGNANCY, URINE  CBG MONITORING, ED    Imaging Review No results found. I have personally reviewed and evaluated these images and lab results as part of my medical decision-making.   EKG Interpretation   Date/Time:  Tuesday August 12 2015 12:56:20 EST Ventricular Rate:  83 PR Interval:  142 QRS Duration: 94 QT Interval:   388 QTC Calculation: 455 R Axis:   88 Text Interpretation:  Normal sinus rhythm Right atrial enlargement  Borderline ECG Reconfirmed by Rubin Payor  MD, Harrold Donath [phone]) on 08/13/2015  9:28:18 AM      MDM   Final diagnoses:  Altered mental status, unspecified altered mental status type  Acute lower UTI (urinary tract infection)  Substance abuse    Patient presented with altered mental status. Like  related to polysubstance abuse but also has urinary tract infection. Mental status improved. Will treat with antibiotics and discharge home.    Benjiman CoreNathan Mathieu Schloemer, MD 08/13/15 [phone]

## 2015-08-12 NOTE — Discharge Instructions (Signed)
Urinary Tract Infection Urinary tract infections (UTIs) can develop anywhere along your urinary tract. Your urinary tract is your body's drainage system for removing wastes and extra water. Your urinary tract includes two kidneys, two ureters, a bladder, and a urethra. Your kidneys are a pair of bean-shaped organs. Each kidney is about the size of your fist. They are located below your ribs, one on each side of your spine. CAUSES Infections are caused by microbes, which are microscopic organisms, including fungi, viruses, and bacteria. These organisms are so small that they can only be seen through a microscope. Bacteria are the microbes that most commonly cause UTIs. SYMPTOMS  Symptoms of UTIs may vary by age and gender of the patient and by the location of the infection. Symptoms in young women typically include a frequent and intense urge to urinate and a painful, burning feeling in the bladder or urethra during urination. Older women and men are more likely to be tired, shaky, and weak and have muscle aches and abdominal pain. A fever may mean the infection is in your kidneys. Other symptoms of a kidney infection include pain in your back or sides below the ribs, nausea, and vomiting. DIAGNOSIS To diagnose a UTI, your caregiver will ask you about your symptoms. Your caregiver will also ask you to provide a urine sample. The urine sample will be tested for bacteria and white blood cells. White blood cells are made by your body to help fight infection. TREATMENT  Typically, UTIs can be treated with medication. Because most UTIs are caused by a bacterial infection, they usually can be treated with the use of antibiotics. The choice of antibiotic and length of treatment depend on your symptoms and the type of bacteria causing your infection. HOME CARE INSTRUCTIONS  If you were prescribed antibiotics, take them exactly as your caregiver instructs you. Finish the medication even if you feel better after  you have only taken some of the medication.  Drink enough water and fluids to keep your urine clear or pale yellow.  Avoid caffeine, tea, and carbonated beverages. They tend to irritate your bladder.  Empty your bladder often. Avoid holding urine for long periods of time.  Empty your bladder before and after sexual intercourse.  After a bowel movement, women should cleanse from front to back. Use each tissue only once. SEEK MEDICAL CARE IF:   You have back pain.  You develop a fever.  Your symptoms do not begin to resolve within 3 days. SEEK IMMEDIATE MEDICAL CARE IF:   You have severe back pain or lower abdominal pain.  You develop chills.  You have nausea or vomiting.  You have continued burning or discomfort with urination. MAKE SURE YOU:   Understand these instructions.  Will watch your condition.  Will get help right away if you are not doing well or get worse.   This information is not intended to replace advice given to you by your health care provider. Make sure you discuss any questions you have with your health care provider.   Document Released: 06/09/2005 Document Revised: 05/21/2015 Document Reviewed: 10/08/2011 Elsevier Interactive Patient Education 2016 [employer].  Polysubstance Abuse When people abuse more than one drug or type of drug it is called polysubstance or polydrug abuse. For example, many smokers also drink alcohol. This is one form of polydrug abuse. Polydrug abuse also refers to the use of a drug to counteract an unpleasant effect produced by another drug. It may also be used to help  with withdrawal from another drug. People who take stimulants may become agitated. Sometimes this agitation is countered with a tranquilizer. This helps protect against the unpleasant side effects. Polydrug abuse also refers to the use of different drugs at the same time.  Anytime drug use is interfering with normal living activities, it has become abuse. This  includes problems with family and friends. Psychological dependence has developed when your mind tells you that the drug is needed. This is usually followed by physical dependence which has developed when continuing increases of drug are required to get the same feeling or "high". This is known as addiction or chemical dependency. A person's risk is much higher if there is a history of chemical dependency in the family. SIGNS OF CHEMICAL DEPENDENCY  You have been told by friends or family that drugs have become a problem.  You fight when using drugs.  You are having blackouts (not remembering what you do while using).  You feel sick from using drugs but continue using.  You lie about use or amounts of drugs (chemicals) used.  You need chemicals to get you going.  You are suffering in work performance or in school because of drug use.  You get sick from use of drugs but continue to use anyway.  You need drugs to relate to people or feel comfortable in social situations.  You use drugs to forget problems. "Yes" answered to any of the above signs of chemical dependency indicates there are problems. The longer the use of drugs continues, the greater the problems will become. If there is a family history of drug or alcohol use, it is best not to experiment with these drugs. Continual use leads to tolerance. After tolerance develops more of the drug is needed to get the same feeling. This is followed by addiction. With addiction, drugs become the most important part of life. It becomes more important to take drugs than participate in the other usual activities of life. This includes relating to friends and family. Addiction is followed by dependency. Dependency is a condition where drugs are now needed not just to get high, but to feel normal. Addiction cannot be cured but it can be stopped. This often requires outside help and the care of professionals. Treatment centers are listed in the yellow  pages under: Cocaine, Narcotics, and Alcoholics Anonymous. Most hospitals and clinics can refer you to a specialized care center. Talk to your caregiver if you need help.   This information is not intended to replace advice given to you by your health care provider. Make sure you discuss any questions you have with your health care provider.   Document Released: 04/21/2005 Document Revised: 11/22/2011 Document Reviewed: 09/04/2014 Elsevier Interactive Patient Education [employer] Inc[employer]

## 2015-10-08 IMAGING — US US RENAL
1 series · 14 of 25 positions shown · non-contrast
Comparison: Abdominal ultrasound 05/09/2012

CLINICAL DATA: Right flank pain, hematuria, pregnant at 28 weeks

EXAM:
RENAL/URINARY TRACT ULTRASOUND COMPLETE

[Series 1: us renal · 14 of 30 slices shown]
[im 1/30]
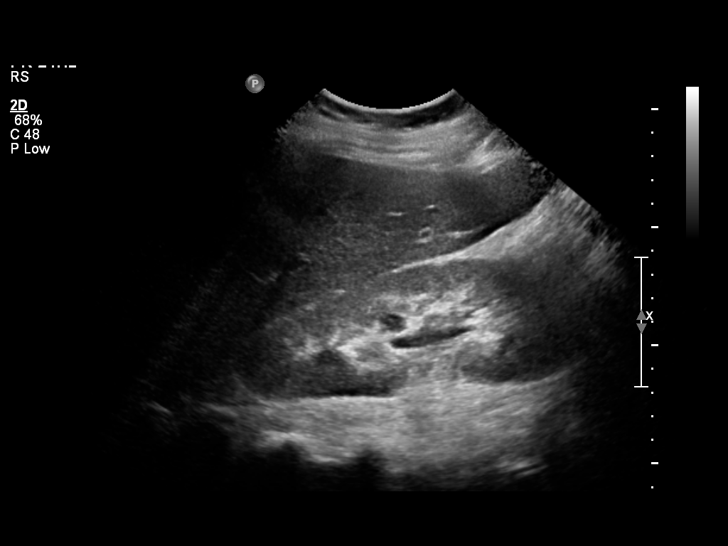
[im 3/30]
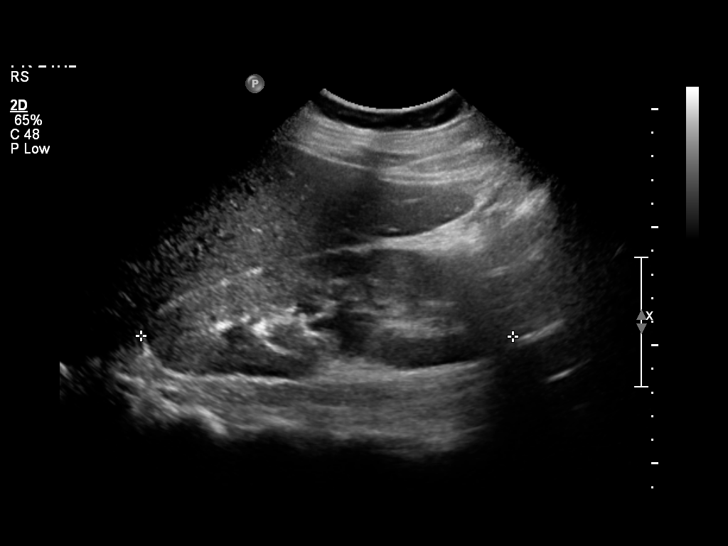
[im 5/30]
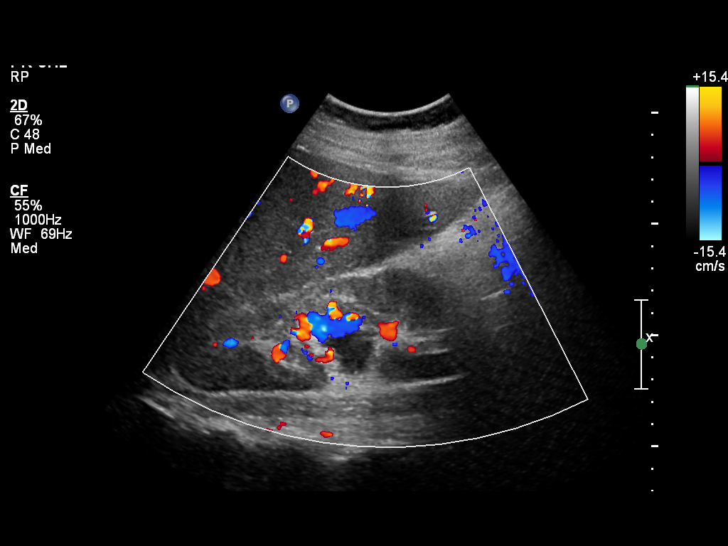
[im 8/30]
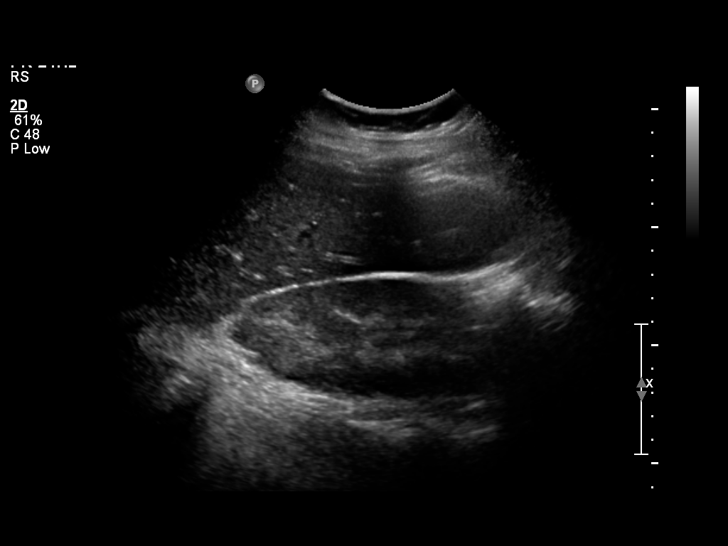
[im 10/30]
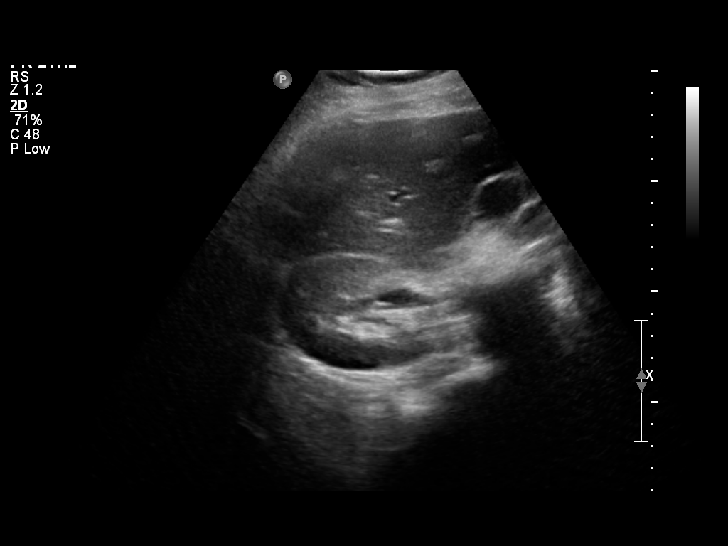
[im 11/30]
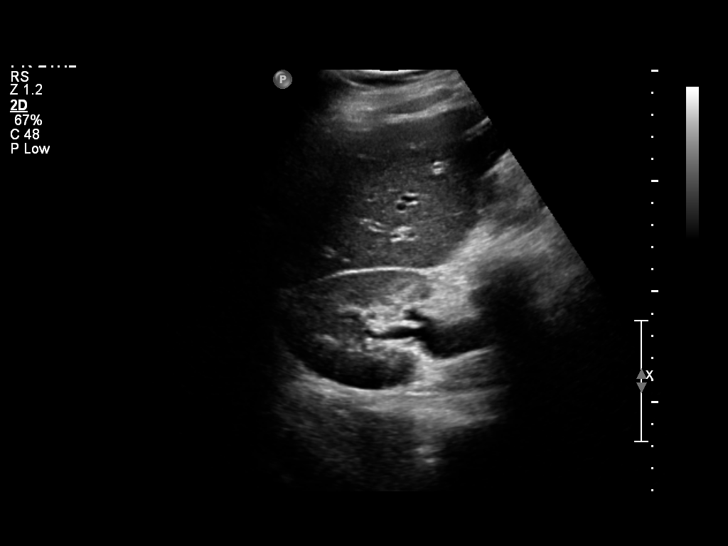
[im 14/30]
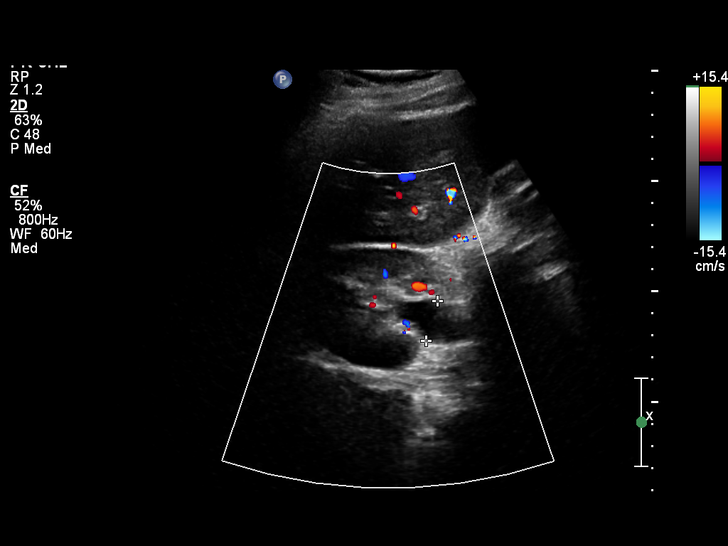
[im 16/30]
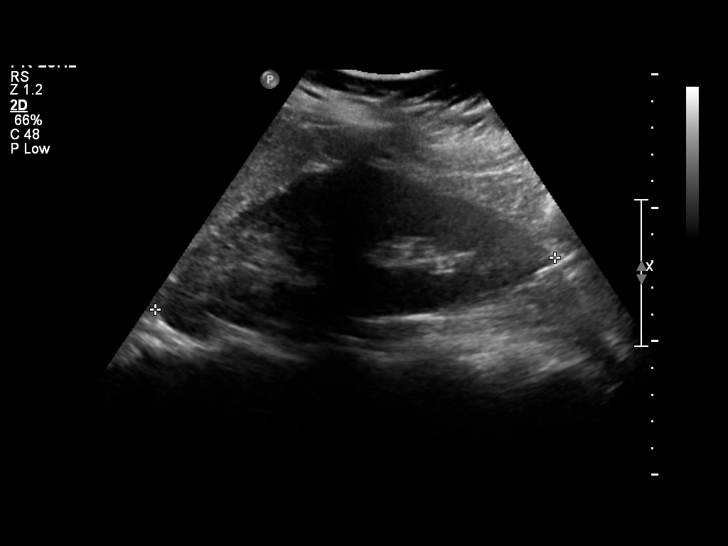
[im 19/30]
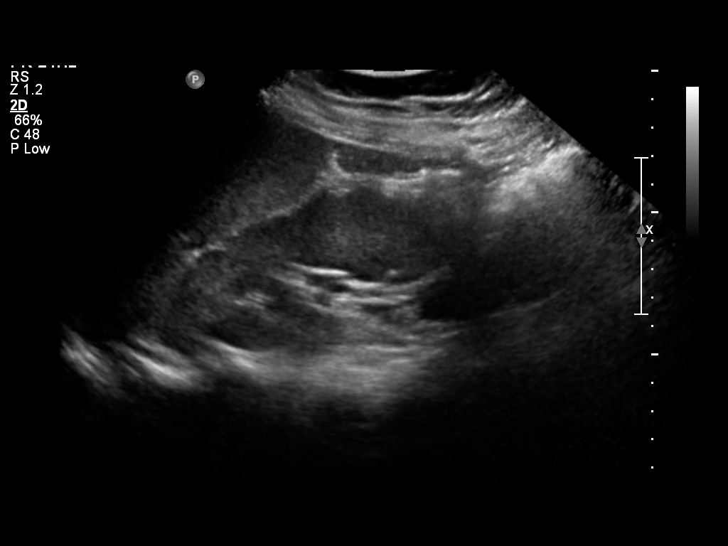
[im 20/30]
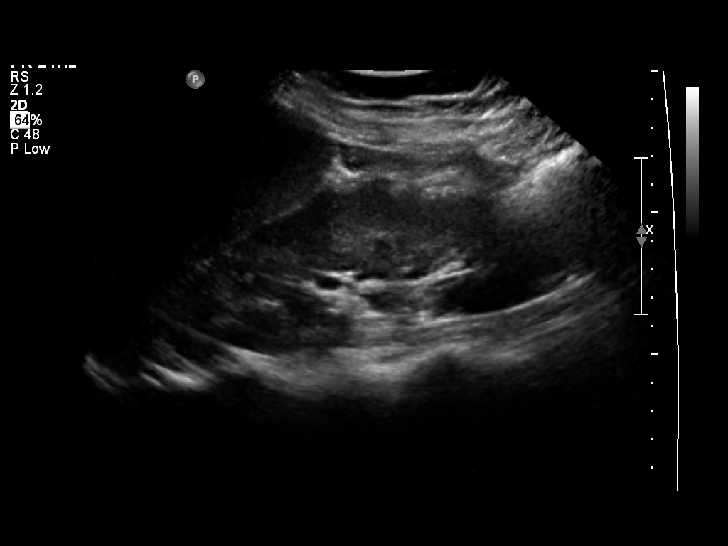
[im 22/30]
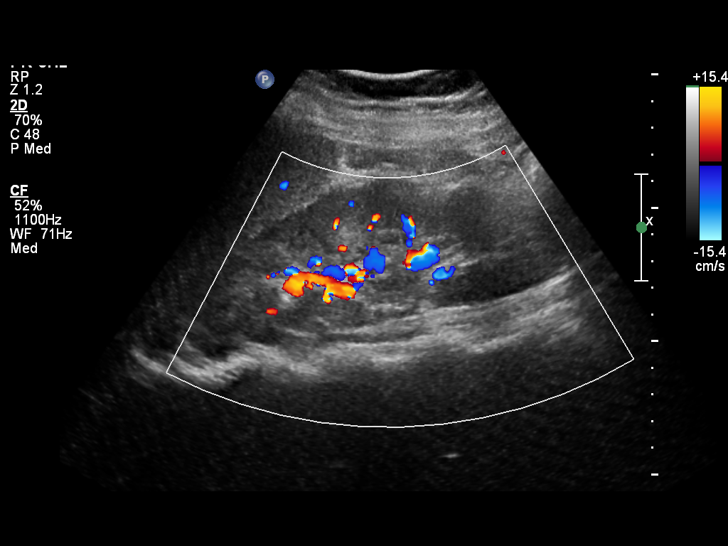
[im 25/30]
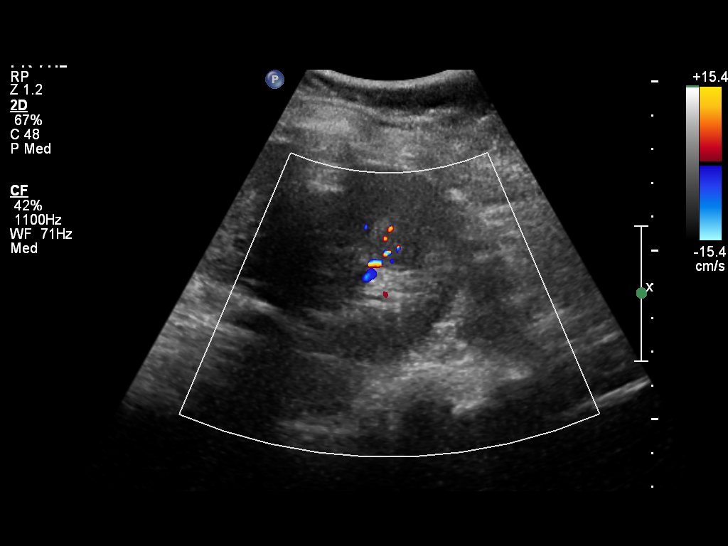
[im 27/30]
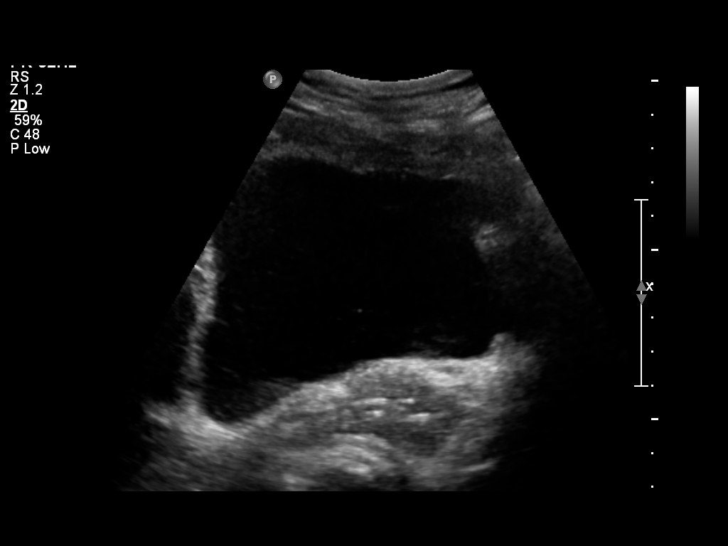
[im 30/30]
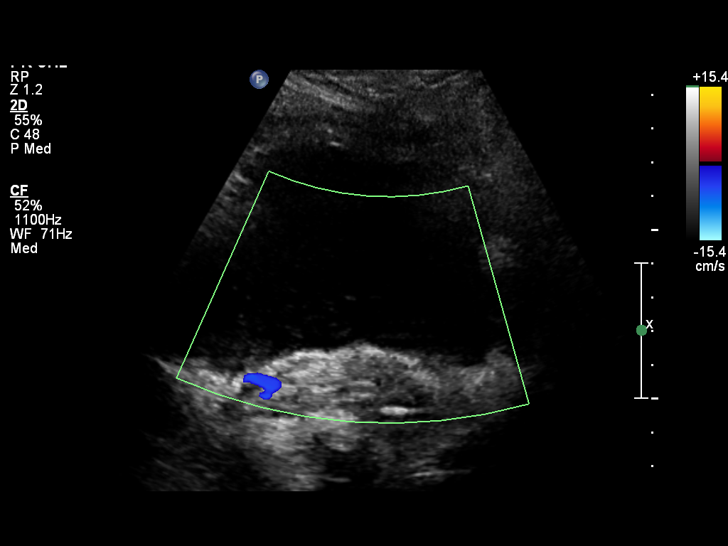

[14 of 25 positions shown; findings below may reference images not displayed]

FINDINGS: Right Kidney:

Length: 16.0 cm length. Normal cortical thickness and echogenicity.
Mild dilatation of the renal pelvis 1.9 cm diameter, new since 7165.
No caliectasis. No mass or shadowing calcification.

Left Kidney:

Length: 15.0 cm length. Normal cortical thickness and echogenicity.
No mass, hydronephrosis or shadowing calcification.

Bladder:

Well distended, normal appearance. Ureteral jets were not visualized
during the period of imaging.
IMPRESSION: Mild dilatation of the right renal pelvis without caliectasis.

Enlarged kidneys bilaterally.

## 2015-12-01 IMAGING — US US OB DETAIL+14 WK
1 of 2 series · 12 of 28 positions shown · non-contrast
Comparison: none

[Series 1: us ob detail +14 wk · 62 acquisitions, 12 frames shown]
[im 1/62]
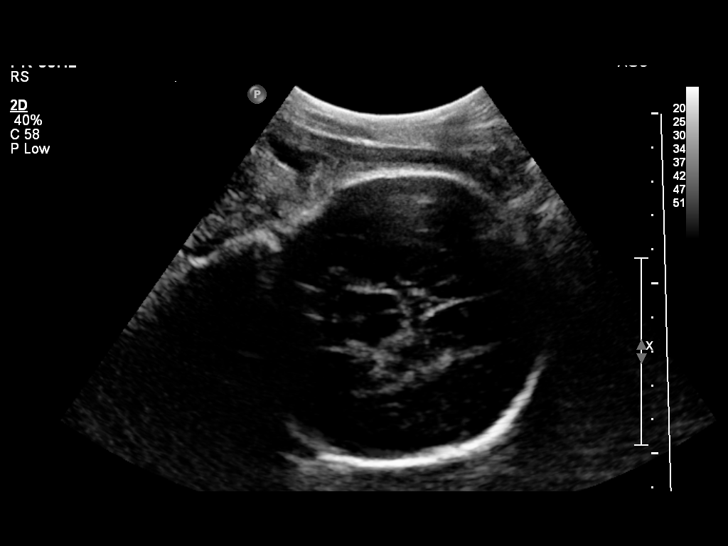
[im 5/62]
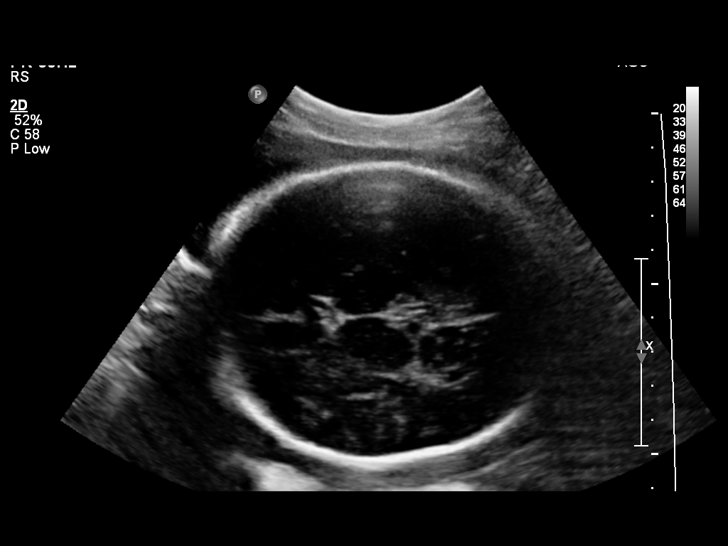
[im 10/62]
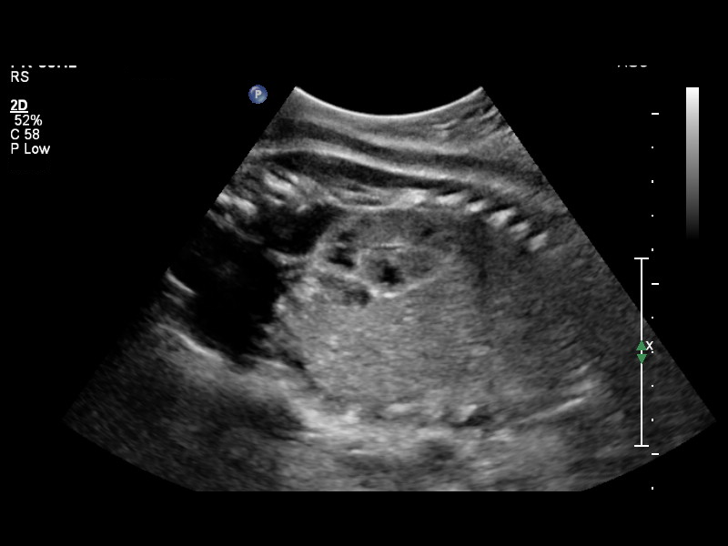
[im 17/62]
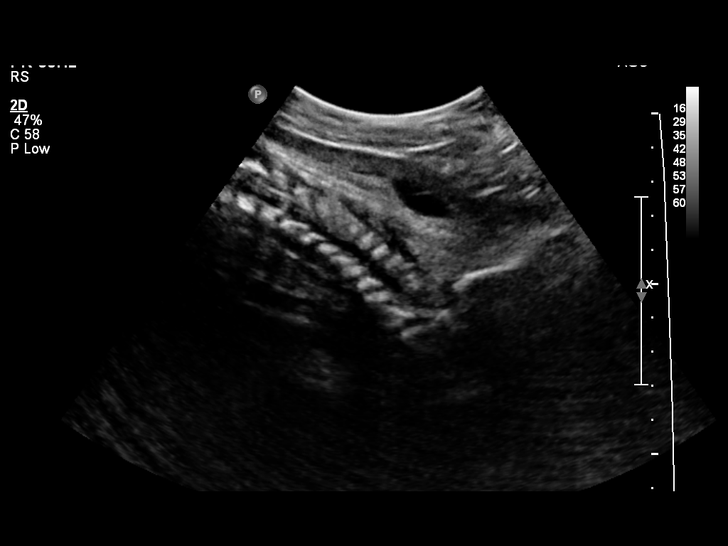
[im 22/62]
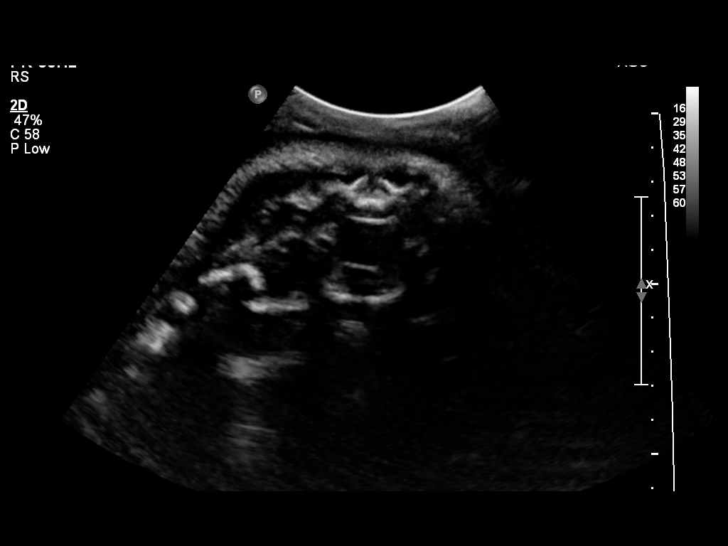
[im 26/62]
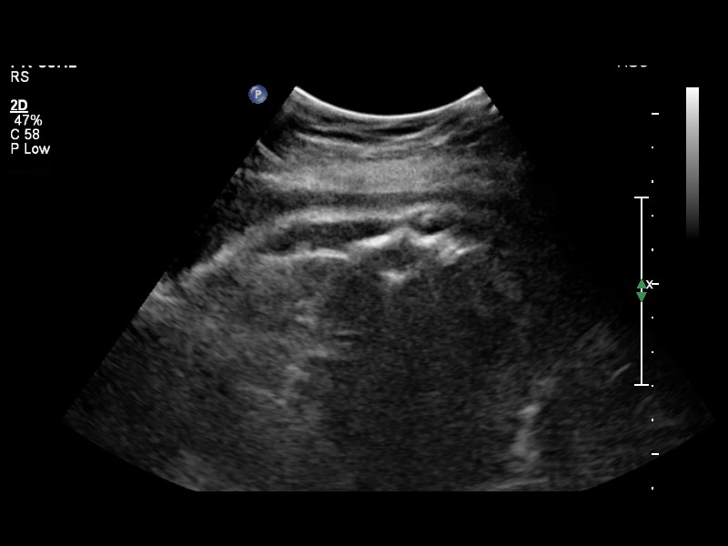
[im 33/62]
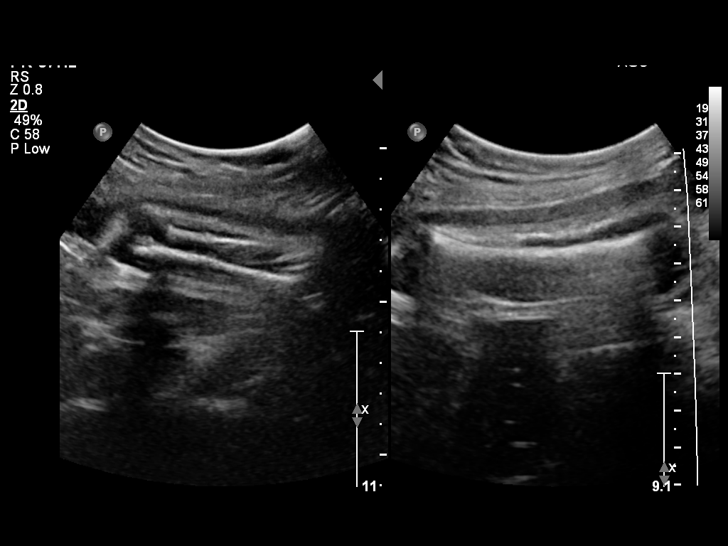
[im 38/62]
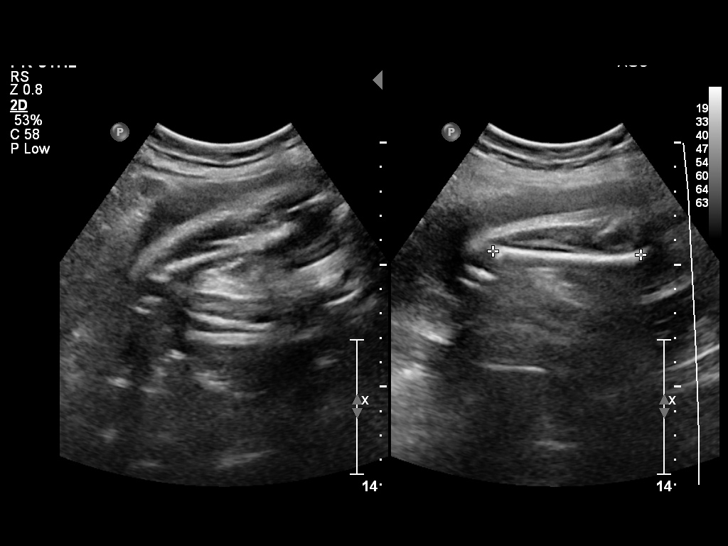
[im 43/62]
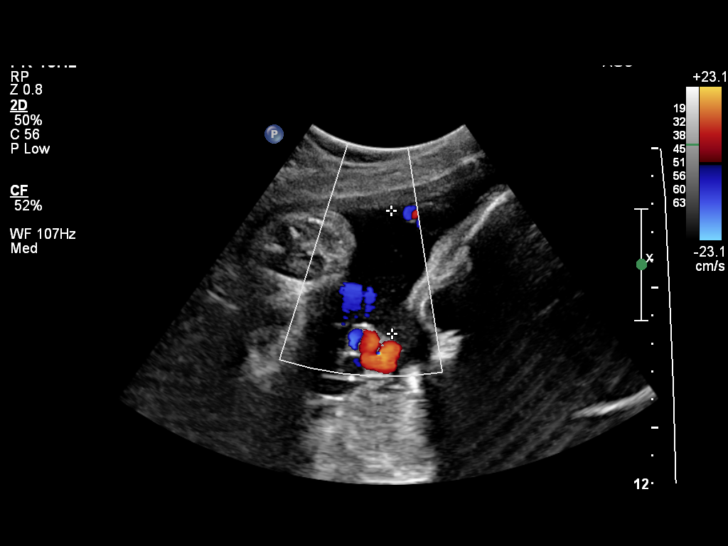
[im 50/62]
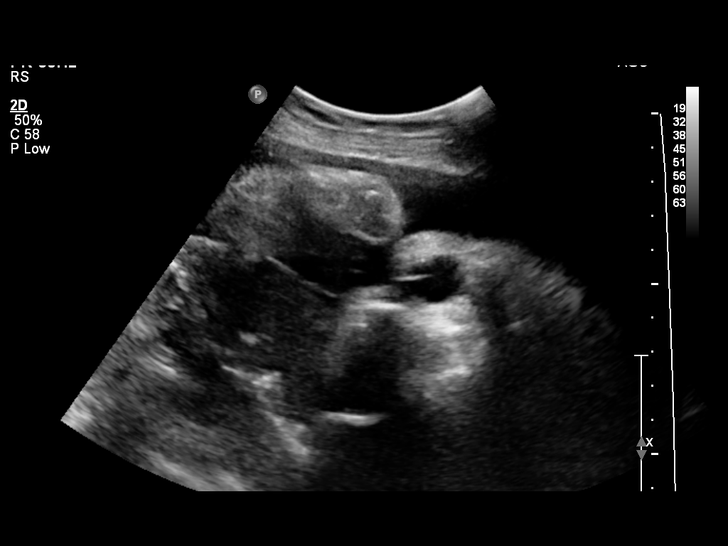
[im 54/62]
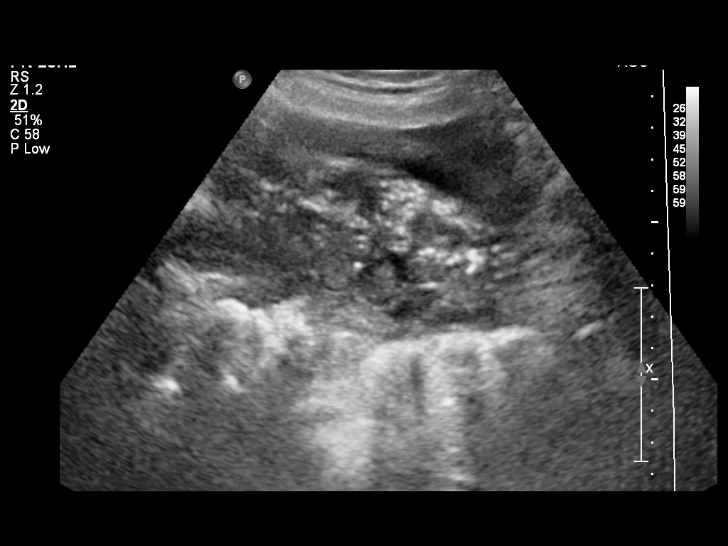
[im 59/62]
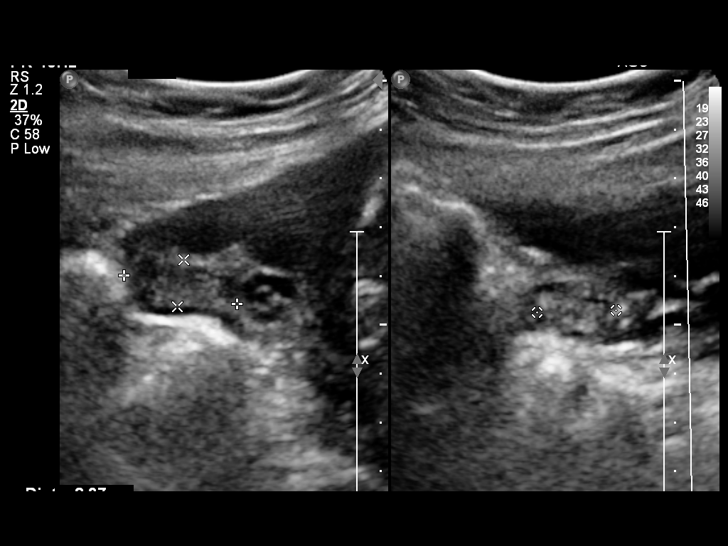

[12 of 28 positions shown; findings below may reference images not displayed]

OBSTETRICS REPORT
                      (Signed Final 02/28/2014 [DATE])

Service(s) Provided

 US OB DETAIL + 14 WK                                  76811.0
Indications

 Detailed fetal anatomic survey
 High risk pregnancy due to maternal drug abuse -      648.40, 305.90,
 Heroin
 Unsure of LMP;  Establish Gestational [AGE]
 No or Little Prenatal Care
 Previous cesarean section
Fetal Evaluation

 Num Of Fetuses:    1
 Fetal Heart Rate:  142                          bpm
 Cardiac Activity:  Observed
 Presentation:      Cephalic
 Placenta:          Posterior, above cervical
                    os
 P. Cord            Not well visualized
 Insertion:

 Amniotic Fluid
 AFI FV:      Subjectively within normal limits
 AFI Sum:     15.96   cm       60  %Tile     Larg Pckt:    5.02  cm
 RUQ:   2.74    cm   RLQ:    4.38   cm    LUQ:   3.82    cm   LLQ:    5.02   cm
Biometry

 BPD:     86.5  mm     G. Age:  34w 6d                CI:        73.89   70 - 86
                                                      FL/HC:      21.7   20.8 -

 HC:     319.6  mm     G. Age:  36w 0d        8  %    HC/AC:      0.97   0.92 -

 AC:     328.2  mm     G. Age:  36w 5d       52  %    FL/BPD:     80.2   71 - 87
 FL:      69.4  mm     G. Age:  35w 4d       16  %    FL/AC:      21.1   20 - 24
 HUM:     61.9  mm     G. Age:  35w 6d       47  %

 Est. FW:    1033  gm      6 lb 5 oz     48  %
Gestational Age

 LMP:           36w 2d        Date:  06/19/13                 EDD:   03/26/14
 U/S Today:     35w 5d                                        EDD:   03/30/14
 Best:          37w 1d     Det. By:  Early Ultrasound         EDD:   03/20/14
                                     (09/01/13)
Anatomy

 Cranium:          Appears normal         Aortic Arch:      Not well visualized
 Fetal Cavum:      Not well visualized    Ductal Arch:      Not well visualized
 Ventricles:       Appears normal         Diaphragm:        Not well visualized
 Choroid Plexus:   Appears normal         Stomach:          Appears normal, left
                                                            sided
 Cerebellum:       Appears normal         Abdomen:          Appears normal
 Posterior Fossa:  Appears normal         Abdominal Wall:   Not well visualized
 Nuchal Fold:      Not applicable (>20    Cord Vessels:     Appears normal (3
                   wks GA)                                  vessel cord)
 Face:             Not well visualized    Kidneys:          Appear normal
 Lips:             Appears normal         Bladder:          Appears normal
 Heart:            Not well visualized    Spine:            Limited views
                                                            appear normal
 RVOT:             Not well visualized    Lower             Visualized
                                          Extremities:
 LVOT:             Not well visualized    Upper             Visualized
                                          Extremities:

 Other:  Fetus appears to be a male. Technically difficult due to advanced GA
         and fetal position.
Cervix Uterus Adnexa

 Cervix:       Not visualized (advanced GA >68wks)
 Uterus:       No abnormality visualized.
 Cul De Sac:   No free fluid seen.

 Left Ovary:    Within normal limits.
 Right Ovary:   Within normal limits.
 Adnexa:     No abnormality visualized.
Impression

 Single IUP at 37w 1d by early ultrasound
 Limited views of the fetal anatomy obtained due to late
 gestational age (fetal heart, face)
 Interval growth is appropriate (48th %tile)
 Posterior placenta without previa
 Normal amniotic fluid volume
Recommendations

 Follow-up ultrasounds as clinically indicated.

 questions or concerns.

## 2016-10-14 DEATH — deceased
